# Patient Record
Sex: Male | Born: 1965
Health system: Southern US, Community
[De-identification: ages and names within clinical notes are randomized; demographics above are authoritative.]

## PROBLEM LIST (undated history)

## (undated) DIAGNOSIS — K219 Gastro-esophageal reflux disease without esophagitis: Secondary | ICD-10-CM

---

## 2004-11-17 ENCOUNTER — Encounter: Admission: RE | Admit: 2004-11-17 | Discharge: 2004-11-17 | Payer: Self-pay | Admitting: Specialist

## 2007-07-04 ENCOUNTER — Emergency Department (HOSPITAL_COMMUNITY): Admission: EM | Admit: 2007-07-04 | Discharge: 2007-07-04 | Payer: Self-pay | Admitting: Emergency Medicine
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
## 2012-11-17 ENCOUNTER — Ambulatory Visit (INDEPENDENT_AMBULATORY_CARE_PROVIDER_SITE_OTHER): Payer: Managed Care, Other (non HMO) | Admitting: Internal Medicine

## 2012-11-17 VITALS — BP 130/84 | HR 72 | Temp 98.2°F | Resp 16 | Ht 68.0 in | Wt 192.6 lb

## 2012-11-17 DIAGNOSIS — N508 Other specified disorders of male genital organs: Secondary | ICD-10-CM

## 2012-11-17 DIAGNOSIS — N5312 Painful ejaculation: Secondary | ICD-10-CM

## 2012-11-17 DIAGNOSIS — J069 Acute upper respiratory infection, unspecified: Secondary | ICD-10-CM

## 2012-11-17 DIAGNOSIS — N35919 Unspecified urethral stricture, male, unspecified site: Secondary | ICD-10-CM

## 2012-11-17 LAB — POCT URINALYSIS DIPSTICK
Bilirubin, UA: NEGATIVE
Blood, UA: NEGATIVE
Glucose, UA: NEGATIVE
Nitrite, UA: NEGATIVE
Protein, UA: NEGATIVE
Spec Grav, UA: >=1.03
Urobilinogen, UA: 0.2
pH, UA: 6

## 2012-11-17 LAB — POCT UA - MICROSCOPIC ONLY
Bacteria, U Microscopic: NEGATIVE
Casts, Ur, LPF, POC: NEGATIVE
Crystals, Ur, HPF, POC: NEGATIVE
Epithelial cells, urine per micros: NEGATIVE
RBC, urine, microscopic: NEGATIVE
Yeast, UA: NEGATIVE

## 2012-11-17 NOTE — Patient Instructions (Signed)
Urology consultation. Continue nyquil and otc meds for URI .Acetaminophen as directed by the manufacturer.

## 2012-11-17 NOTE — Progress Notes (Signed)
  Subjective:    Patient ID: Albert Mccormick, male    DOB: 25-Jun-1966, 46 y.o.   MRN: [id number]  HPI Chills, aches, stuffy nose for 2 days taking nyquil No cough, no shortness of breath, no sore throat,no earache,   Painful ejaculation for 5 years, no dysuria, sometimes difficult to void. No std, no discharge, no trauma Onset. Pain is moderated in severity, no radiation, no testicle pain or swelling. It is so painful he has to scream every time he ejaculates.   Review of Systems  Constitutional: Negative.   HENT: Positive for congestion and sneezing.   Eyes: Negative.   Respiratory: Negative.   Cardiovascular: Negative.   Gastrointestinal: Negative.   Genitourinary: Positive for dysuria and penile pain.  Musculoskeletal: Negative.   Skin: Negative.   Neurological: Negative.   Hematological: Negative.   Psychiatric/Behavioral: Negative.   All other systems reviewed and are negative.       Objective:   Physical Exam  Nursing note and vitals reviewed. Constitutional: He is oriented to person, place, and time. He appears well-developed and well-nourished.  HENT:  Head: Normocephalic and atraumatic.  Right Ear: External ear normal.  Left Ear: External ear normal.  Nose: Rhinorrhea present.  Mouth/Throat: Oropharynx is clear and moist.  Eyes: Conjunctivae normal and EOM are normal. Pupils are equal, round, and reactive to light.  Neck: Normal range of motion. Neck supple.  Cardiovascular: Normal rate, regular rhythm, normal heart sounds and intact distal pulses.   Pulmonary/Chest: Effort normal and breath sounds normal.  Abdominal: Soft. Bowel sounds are normal.  Genitourinary: No penile tenderness.       Urethral opening appears stenotic circumsided  Musculoskeletal: Normal range of motion.  Neurological: He is alert and oriented to person, place, and time. He has normal reflexes.  Skin: Skin is warm and dry.  Psychiatric: He has a normal mood and affect. His behavior is  normal. Judgment and thought content normal.     Results for orders placed in visit on 11/17/12  POCT URINALYSIS DIPSTICK      Component Value Range   Color, UA yellow     Clarity, UA ckear     Glucose, UA neg     Bilirubin, UA neg     Ketones, UA trace     Spec Grav, UA >=1.030     Blood, UA neg     pH, UA 6.0     Protein, UA neg     Urobilinogen, UA 0.2     Nitrite, UA neg     Leukocytes, UA Negative    POCT UA - MICROSCOPIC ONLY      Component Value Range   WBC, Ur, HPF, POC 0-2     RBC, urine, microscopic neg     Bacteria, U Microscopic neg     Mucus, UA trace     Epithelial cells, urine per micros neg     Crystals, Ur, HPF, POC neg     Casts, Ur, LPF, POC neg     Yeast, UA neg    ua is normal.     Assessment & Plan:  Will obtain ua and chlamydia gonorrhea testing . Will refer to urology for urethral stenosis and painful ejaculation.

## 2012-11-19 ENCOUNTER — Telehealth: Payer: Self-pay

## 2012-11-19 LAB — GC/CHLAMYDIA PROBE AMP, URINE: Chlamydia, Swab/Urine, PCR: NEGATIVE

## 2012-11-19 NOTE — Telephone Encounter (Signed)
Pt is calling back for lab results  Best number [phone]

## 2012-11-19 NOTE — Telephone Encounter (Signed)
See labs 

## 2014-11-19 ENCOUNTER — Ambulatory Visit: Payer: BC Managed Care – PPO

## 2014-11-20 ENCOUNTER — Ambulatory Visit (INDEPENDENT_AMBULATORY_CARE_PROVIDER_SITE_OTHER): Payer: BC Managed Care – PPO | Admitting: Family Medicine

## 2014-11-20 VITALS — BP 118/74 | HR 73 | Temp 98.6°F | Resp 16 | Ht 68.0 in | Wt 200.0 lb

## 2014-11-20 DIAGNOSIS — R82998 Other abnormal findings in urine: Secondary | ICD-10-CM

## 2014-11-20 DIAGNOSIS — R112 Nausea with vomiting, unspecified: Secondary | ICD-10-CM

## 2014-11-20 DIAGNOSIS — Z202 Contact with and (suspected) exposure to infections with a predominantly sexual mode of transmission: Secondary | ICD-10-CM

## 2014-11-20 DIAGNOSIS — M791 Myalgia, unspecified site: Secondary | ICD-10-CM

## 2014-11-20 DIAGNOSIS — H60392 Other infective otitis externa, left ear: Secondary | ICD-10-CM

## 2014-11-20 DIAGNOSIS — R8299 Other abnormal findings in urine: Secondary | ICD-10-CM

## 2014-11-20 DIAGNOSIS — T732XXA Exhaustion due to exposure, initial encounter: Secondary | ICD-10-CM

## 2014-11-20 LAB — POCT CBC
Granulocyte percent: 59.7 %G (ref 37–80)
HCT, POC: 50.5 % (ref 43.5–53.7)
Hemoglobin: 16.6 g/dL (ref 14.1–18.1)
Lymph, poc: 2 (ref 0.6–3.4)
MCH, POC: 30.1 pg (ref 27–31.2)
MCHC: 32.9 g/dL (ref 31.8–35.4)
MCV: 91.7 fL (ref 80–97)
MID (cbc): 0.2 (ref 0–0.9)
MPV: 7.5 fL (ref 0–99.8)
POC Granulocyte: 3.2 (ref 2–6.9)
POC LYMPH PERCENT: 36.2 %L (ref 10–50)
POC MID %: 4.1 %M (ref 0–12)
Platelet Count, POC: 185 10*3/uL (ref 142–424)
RBC: 5.51 M/uL (ref 4.69–6.13)
RDW, POC: 13.8 %
WBC: 5.4 10*3/uL (ref 4.6–10.2)

## 2014-11-20 LAB — COMPREHENSIVE METABOLIC PANEL
ALT: 24 U/L (ref 0–53)
AST: 21 U/L (ref 0–37)
Albumin: 4.2 g/dL (ref 3.5–5.2)
Alkaline Phosphatase: 91 U/L (ref 39–117)
BUN: 14 mg/dL (ref 6–23)
CO2: 27 mEq/L (ref 19–32)
Calcium: 9.4 mg/dL (ref 8.4–10.5)
Chloride: 103 mEq/L (ref 96–112)
Creat: 0.99 mg/dL (ref 0.50–1.35)
Glucose, Bld: 103 mg/dL — ABNORMAL HIGH (ref 70–99)
Potassium: 4.1 mEq/L (ref 3.5–5.3)
Sodium: 139 mEq/L (ref 135–145)
Total Bilirubin: 0.3 mg/dL (ref 0.2–1.2)
Total Protein: 7 g/dL (ref 6.0–8.3)

## 2014-11-20 LAB — POCT URINALYSIS DIPSTICK
Bilirubin, UA: NEGATIVE
Blood, UA: NEGATIVE
Glucose, UA: NEGATIVE
Ketones, UA: NEGATIVE
Leukocytes, UA: NEGATIVE
Nitrite, UA: NEGATIVE
Protein, UA: NEGATIVE
Spec Grav, UA: 1.025
Urobilinogen, UA: 0.2
pH, UA: 6

## 2014-11-20 LAB — POCT UA - MICROSCOPIC ONLY
Casts, Ur, LPF, POC: NEGATIVE
Crystals, Ur, HPF, POC: NEGATIVE
Yeast, UA: NEGATIVE

## 2014-11-20 LAB — HIV ANTIBODY (ROUTINE TESTING W REFLEX): HIV 1&2 Ab, 4th Generation: NONREACTIVE

## 2014-11-20 LAB — GLUCOSE, POCT (MANUAL RESULT ENTRY): POC Glucose: 113 mg/dl — AB (ref 70–99)

## 2014-11-20 LAB — RPR

## 2014-11-20 MED ORDER — ONDANSETRON 4 MG PO TBDP: 4.0000 mg | ORAL_TABLET | Freq: Three times a day (TID) | ORAL | Status: DC | PRN

## 2014-11-20 MED ORDER — NAPROXEN 500 MG PO TABS: 500.0000 mg | ORAL_TABLET | Freq: Two times a day (BID) | ORAL | Status: DC

## 2014-11-20 MED ORDER — OFLOXACIN 0.3 % OT SOLN: 5.0000 [drp] | Freq: Two times a day (BID) | OTIC | Status: DC

## 2014-11-20 NOTE — Progress Notes (Signed)
Subjective: For the last 2 or 3 weeks the patient has been feeling bad. He works at a [employer]. He has been working in the cockpit, then had to be outside in the cold for couple of hours. Since then he has been having fatigue, generalized malaise, nausea, dark yellow urine, and just doesn't feel well. He is divorced and now has a fianc. No major concerns of STDs, but has had potential exposure in the change of partners.  Objective: 48 year old man major distress. He speaks with an accent from [place]. TMs are both occluded with cerumen. He has throat is clear. Neck supple without significant nodes. No thyromegaly. Chest is clear to auscultation heart regular without murmurs gallops or arrhythmias. Abdomen soft without mass tenderness. No CVA tenderness.  Assessment: Fatigue Dark yellow urine Generalized malaise Nausea Potential of STD exposures Cerumen impaction.  Plan: Labs Irrigate ears  Results for orders placed or performed in visit on 11/20/14  POCT CBC  Result Value Ref Range   WBC 5.4 4.6 - 10.2 K/uL   Lymph, poc 2.0 0.6 - 3.4   POC LYMPH PERCENT 36.2 10 - 50 %L   MID (cbc) 0.2 0 - 0.9   POC MID % 4.1 0 - 12 %M   POC Granulocyte 3.2 2 - 6.9   Granulocyte percent 59.7 37 - 80 %G   RBC 5.51 4.69 - 6.13 M/uL   Hemoglobin 16.6 14.1 - 18.1 g/dL   HCT, POC 16.150.5 09.643.5 - 53.7 %   MCV 91.7 80 - 97 fL   MCH, POC 30.1 27 - 31.2 pg   MCHC 32.9 31.8 - 35.4 g/dL   RDW, POC 04.513.8 %   Platelet Count, POC 185 142 - 424 K/uL   MPV 7.5 0 - 99.8 fL  POCT glucose (manual entry)  Result Value Ref Range   POC Glucose 113 (A) 70 - 99 mg/dl  POCT UA - Microscopic Only  Result Value Ref Range   WBC, Ur, HPF, POC 0-2    RBC, urine, microscopic 0-1    Bacteria, U Microscopic trace    Mucus, UA trace    Epithelial cells, urine per micros 1-3    Crystals, Ur, HPF, POC neg    Casts, Ur, LPF, POC neg    Yeast, UA neg   POCT urinalysis dipstick  Result Value Ref Range   Color, UA  yellow    Clarity, UA clear    Glucose, UA neg    Bilirubin, UA neg    Ketones, UA neg    Spec Grav, UA 1.025    Blood, UA neg    pH, UA 6.0    Protein, UA neg    Urobilinogen, UA 0.2    Nitrite, UA neg    Leukocytes, UA Negative    The medical assistant irrigated a large amount of cerumen out of both ears but was unable to get some white material out of the left ear. Under direct visualization this was irrigated and then removed with alligator forceps. It was a twist of probable cotton or tissue foreign object in the ear. It causes some pain, and it looks very inflamed where that was plastered to the ear canal. This will be treated.  Naproxen for the myalgia and generalized malaise  Drink more water  Take ondansetron if needed for nausea

## 2014-11-20 NOTE — Patient Instructions (Signed)
Take the ondansetron one every 6 hours only when needed for feeling like you are going to vomit  Use the eardrops 5 drops into the left ear twice daily for a few days  Take naproxen one twice daily for the aching and generalized bad feeling  Drink more water and make sure you get enough rest  Return if not improving  I will let you know the results of the rest your labs

## 2014-11-23 LAB — GC/CHLAMYDIA PROBE AMP
CT Probe RNA: NEGATIVE
GC Probe RNA: NEGATIVE

## 2014-11-25 ENCOUNTER — Encounter: Payer: Self-pay | Admitting: Family Medicine

## 2017-12-12 ENCOUNTER — Other Ambulatory Visit: Payer: Self-pay

## 2017-12-12 ENCOUNTER — Ambulatory Visit (INDEPENDENT_AMBULATORY_CARE_PROVIDER_SITE_OTHER): Payer: BLUE CROSS/BLUE SHIELD | Admitting: Family Medicine

## 2017-12-12 ENCOUNTER — Encounter: Payer: Self-pay | Admitting: Family Medicine

## 2017-12-12 VITALS — BP 120/66 | HR 64 | Temp 98.4°F | Resp 16 | Ht 68.0 in | Wt 197.4 lb

## 2017-12-12 DIAGNOSIS — Z113 Encounter for screening for infections with a predominantly sexual mode of transmission: Secondary | ICD-10-CM | POA: Diagnosis not present

## 2017-12-12 DIAGNOSIS — N5312 Painful ejaculation: Secondary | ICD-10-CM

## 2017-12-12 DIAGNOSIS — Z23 Encounter for immunization: Secondary | ICD-10-CM

## 2017-12-12 DIAGNOSIS — Z1211 Encounter for screening for malignant neoplasm of colon: Secondary | ICD-10-CM

## 2017-12-12 DIAGNOSIS — Z125 Encounter for screening for malignant neoplasm of prostate: Secondary | ICD-10-CM | POA: Diagnosis not present

## 2017-12-12 LAB — POCT URINALYSIS DIP (MANUAL ENTRY)
BILIRUBIN UA: NEGATIVE
Blood, UA: NEGATIVE
GLUCOSE UA: NEGATIVE mg/dL
Ketones, POC UA: NEGATIVE mg/dL
Leukocytes, UA: NEGATIVE
Nitrite, UA: NEGATIVE
Protein Ur, POC: NEGATIVE mg/dL
Spec Grav, UA: 1.01 (ref 1.010–1.025)
UROBILINOGEN UA: 0.2 U/dL
pH, UA: 5.5 (ref 5.0–8.0)

## 2017-12-12 NOTE — Progress Notes (Deleted)
No chief complaint on file.   Subjective:  Albert Mccormick is a 52 y.o. male here for a health maintenance visit.  Patient is {new /established:20252} pt  There are no active problems to display for this patient.   No past medical history on file.  No past surgical history on file.   Outpatient Medications Prior to Visit  Medication Sig Dispense Refill  . DM-Doxylamine-Acetaminophen (NYQUIL COLD & FLU PO) Take by mouth.    . naproxen (NAPROSYN) 500 MG tablet Take 1 tablet (500 mg total) by mouth 2 (two) times daily with a meal. 30 tablet 0  . ofloxacin (FLOXIN) 0.3 % otic solution Place 5 drops into the left ear 2 (two) times daily. 5 mL 0  . ondansetron (ZOFRAN ODT) 4 MG disintegrating tablet Take 1 tablet (4 mg total) by mouth every 8 (eight) hours as needed for nausea or vomiting. 10 tablet 0   No facility-administered medications prior to visit.     No Known Allergies   No family history on file.   Health Habits: Dental Exam: up to date Eye Exam: up to date Exercise: *** times/week on average Current exercise activities: walking/running Diet:   Social History   Socioeconomic History  . Marital status: Married    Spouse name: Not on file  . Number of children: Not on file  . Years of education: Not on file  . Highest education level: Not on file  Social Needs  . Financial resource strain: Not on file  . Food insecurity - worry: Not on file  . Food insecurity - inability: Not on file  . Transportation needs - medical: Not on file  . Transportation needs - non-medical: Not on file  Occupational History  . Not on file  Tobacco Use  . Smoking status: Never Smoker  Substance and Sexual Activity  . Alcohol use: No  . Drug use: No  . Sexual activity: Not on file  Other Topics Concern  . Not on file  Social History Narrative  . Not on file   Social History   Substance and Sexual Activity  Alcohol Use No   Social History   Tobacco Use  Smoking  Status Never Smoker   Social History   Substance and Sexual Activity  Drug Use No     Health Maintenance: See under health Maintenance activity for review of completion dates as well.  There is no immunization history on file for this patient.    Depression Screen-PHQ2/9 No flowsheet data found.      Depression Severity and Treatment Recommendations:  0-4= None  5-9= Mild / Treatment: Support, educate to call if worse; return in one month  10-14= Moderate / Treatment: Support, watchful waiting; Antidepressant or Psycotherapy  15-19= Moderately severe / Treatment: Antidepressant OR Psychotherapy  >= 20 = Major depression, severe / Antidepressant AND Psychotherapy    Review of Systems   ROS  See HPI for ROS as well.    Objective:   There were no vitals filed for this visit.  There is no height or weight on file to calculate BMI.  Physical Exam     Assessment/Plan:   Patient was seen for a health maintenance exam.  Counseled the patient on health maintenance issues. Reviewed her health mainteance schedule and ordered appropriate tests (see orders.) Counseled on regular exercise and weight management. Recommend regular eye exams and dental cleaning.   The following issues were addressed today for health maintenance:   There are no diagnoses  linked to this encounter.  No Follow-up on file.    There is no height or weight on file to calculate BMI.:  Discussed the patient's BMI with patient. The BMI body mass index is unknown because there is no height or weight on file.     Future Appointments  Date Time Provider Department Center  12/12/2017  8:20 AM Doristine BosworthStallings, Anupama Piehl A, MD [facility]CP-[facility]    There are no Patient Instructions on file for this visit.

## 2017-12-12 NOTE — Patient Instructions (Addendum)
Try tinactin powder for your feet and change your socks often to prevent foot odor   Colorectal Cancer Screening Colorectal cancer screening is a group of tests used to check for colorectal cancer. Colorectal refers to your colon and rectum. Your colon and rectum are located at the end of your large intestine and carry your bowel movements out of your body. Why is colorectal cancer screening done? It is common for abnormal growths (polyps) to form in the lining of your colon, especially as you get older. These polyps can be cancerous or become cancerous. If colorectal cancer is found at an early stage, it is treatable. Who should be screened for colorectal cancer? Screening is recommended for all adults at average risk starting at age 52. Tests may be recommended every 1 to 10 years. Your health care provider may recommend earlier or more frequent screening if you have:  A history of colorectal cancer or polyps.  A family member with a history of colorectal cancer or polyps.  Inflammatory bowel disease, such as ulcerative colitis or Crohn disease.  A type of hereditary colon cancer syndrome.  Colorectal cancer symptoms.  Types of screening tests There are several types of colorectal screening tests. They include:  Guaiac-based fecal occult blood testing.  Fecal immunochemical test (FIT).  Stool DNA test.  Barium enema.  Virtual colonoscopy.  Sigmoidoscopy. During this test, a sigmoidoscope is used to examine your rectum and lower colon. A sigmoidoscope is a flexible tube with a camera that is inserted through your anus into your rectum and lower colon.  Colonoscopy. During this test, a colonoscope is used to examine your entire colon. A colonoscope is a long, thin, flexible tube with a camera. This test examines your entire colon and rectum.  This information is not intended to replace advice given to you by your health care provider. Make sure you discuss any questions you have  with your health care provider. Document Released: 05/09/2010 Document Revised: 06/28/2016 Document Reviewed: 02/25/2014 Elsevier Interactive Patient Education  [employer]2018 Elsevier Inc.

## 2017-12-12 NOTE — Progress Notes (Signed)
Chief Complaint  Patient presents with  . Urinary Tract Infection    pain with ejaculation    HPI  He reports that when he ejaculates there is pain in his penis He states that his partner had testing and she is fine He reports that he has had this with 2 separate partners over the course of 3 years The pain is sharp and stabbing in character that causes him to stop intercourse He states that if he masturbates there is no pain  No blood in the semen No difficulty with erections No pain in the scrotum He denies dysuria No rectal pain  He denies history of std  He was previously with his wife and now is with his fiance   4 review of systems  No past medical history on file.  Current Outpatient Medications  Medication Sig Dispense Refill  . DM-Doxylamine-Acetaminophen (NYQUIL COLD & FLU PO) Take by mouth.    . naproxen (NAPROSYN) 500 MG tablet Take 1 tablet (500 mg total) by mouth 2 (two) times daily with a meal. (Patient not taking: Reported on 12/12/2017) 30 tablet 0  . ofloxacin (FLOXIN) 0.3 % otic solution Place 5 drops into the left ear 2 (two) times daily. (Patient not taking: Reported on 12/12/2017) 5 mL 0  . ondansetron (ZOFRAN ODT) 4 MG disintegrating tablet Take 1 tablet (4 mg total) by mouth every 8 (eight) hours as needed for nausea or vomiting. (Patient not taking: Reported on 12/12/2017) 10 tablet 0   No current facility-administered medications for this visit.     Allergies: No Known Allergies  No past surgical history on file.  Social History   Socioeconomic History  . Marital status: Married    Spouse name: Not on file  . Number of children: Not on file  . Years of education: Not on file  . Highest education level: Not on file  Social Needs  . Financial resource strain: Not on file  . Food insecurity - worry: Not on file  . Food insecurity - inability: Not on file  . Transportation needs - medical: Not on file  . Transportation needs - non-medical:  Not on file  Occupational History  . Not on file  Tobacco Use  . Smoking status: Never Smoker  . Smokeless tobacco: Never Used  Substance and Sexual Activity  . Alcohol use: No  . Drug use: No  . Sexual activity: Not on file  Other Topics Concern  . Not on file  Social History Narrative  . Not on file    No family history on file.   ROS Review of Systems See HPI Constitution: No fevers or chills No malaise No diaphoresis Skin: No rash or itching Eyes: no blurry vision, no double vision GU: no dysuria or hematuria, see hpi Neuro: no dizziness or headaches  all others reviewed and negative   Objective: Vitals:   12/12/17 0841  BP: 120/66  Pulse: 64  Resp: 16  Temp: 98.4 F (36.9 C)  TempSrc: Oral  SpO2: 99%  Weight: 197 lb 6.4 oz (89.5 kg)  Height: 5\' 8"  (1.727 m)    Physical Exam  Constitutional: He is oriented to person, place, and time. He appears well-developed and well-nourished.  HENT:  Head: Normocephalic and atraumatic.  Eyes: Conjunctivae and EOM are normal.  Neck: Normal range of motion. No thyromegaly present.  Cardiovascular: Normal rate, regular rhythm and normal heart sounds.  Pulmonary/Chest: Effort normal and breath sounds normal. No stridor. No respiratory distress.  Abdominal: Soft. Bowel sounds are normal. He exhibits no distension and no mass. There is no tenderness. There is no rebound and no guarding. No hernia.  Neurological: He is alert and oriented to person, place, and time.  Psychiatric: He has a normal mood and affect. His behavior is normal. Judgment and thought content normal.    GU exam chaperone present Circumcised penis No inguinal hernias Normal testicular exam Normal penile exam No tenderness No LAD Rectal exam  Normal rectal tone Normal prostate exam  Component     Latest Ref Rng & Units 12/12/2017 12/12/2017        10:49 AM 10:49 AM  Color, UA     yellow yellow   Clarity, UA     clear clear   Glucose      negative mg/dL negative   Bilirubin, UA     negative negative negative  Specific Gravity, UA     1.010 - 1.025 1.010   RBC, UA     negative negative   pH, UA     5.0 - 8.0 5.5   Protein, UA     negative mg/dL negative   Urobilinogen, UA     0.2 or 1.0 E.U./dL 0.2   Nitrite, UA     Negative Negative   Leukocytes, UA     Negative Negative      Assessment and Plan Leo Grossermbrose was seen today for annual exam.  Diagnoses and all orders for this visit:  Painful ejaculation- will check for factors such as prostate disease or infection -     PSA -     POCT urinalysis dipstick  Screen for STD (sexually transmitted disease) -     GC/Chlamydia Probe Amp -     HIV antibody -     RPR -     Hepatitis B surface antigen  Screening for prostate cancer- no family history, routine screening -     PSA  Encounter for screening colonoscopy- discussed screening for colon cancer -     Ambulatory referral to Gastroenterology  Flu vaccine need -     Flu Vaccine QUAD 36+ mos IM  Other orders -     Cancel: Tdap vaccine greater than or equal to 7yo IM -     Cancel: Ambulatory referral to Gastroenterology   A total of 25 minutes were spent face-to-face with the patient during this encounter and over half of that time was spent on counseling and coordination of care.   Zoe A Stallings

## 2017-12-14 LAB — PSA: PROSTATE SPECIFIC AG, SERUM: 1.4 ng/mL (ref 0.0–4.0)

## 2017-12-14 LAB — GC/CHLAMYDIA PROBE AMP
Chlamydia trachomatis, NAA: NEGATIVE
Neisseria gonorrhoeae by PCR: NEGATIVE

## 2017-12-14 LAB — HEPATITIS B SURFACE ANTIGEN: Hepatitis B Surface Ag: NEGATIVE

## 2017-12-14 LAB — RPR: RPR: NONREACTIVE

## 2017-12-14 LAB — HIV ANTIBODY (ROUTINE TESTING W REFLEX): HIV SCREEN 4TH GENERATION: NONREACTIVE

## 2017-12-17 ENCOUNTER — Encounter: Payer: Self-pay | Admitting: Internal Medicine

## 2017-12-25 ENCOUNTER — Encounter: Payer: Self-pay | Admitting: Family Medicine

## 2017-12-26 ENCOUNTER — Telehealth: Payer: Self-pay

## 2017-12-26 NOTE — Telephone Encounter (Signed)
Lab results sent via mail to home address. Dgaddy, CMA 

## 2018-02-05 ENCOUNTER — Encounter: Payer: Self-pay | Admitting: *Deleted

## 2018-02-05 NOTE — Progress Notes (Signed)
[facility]. He explains that he is here for "penis problem", then he explains what is going on every time he has sex. I did see this information in his PCP notes from Dr.Stallings. I did explain to him the reason why a colonoscopy is done and gave him a informational pamphlet. Answered all of his questions as best I could. Pt decided he does not want to have this because he does not want a "big bill". Explained that he could check with his insurance provider that this routine "screening" colonoscopy maybe covered by his insurance. He still decided to cancel the colonoscopy at this time and he will go back to see his PCP for his "penis problem".  Pt aware his appointments here are cancelled.

## 2018-02-19 ENCOUNTER — Encounter: Payer: Self-pay | Admitting: Internal Medicine

## 2019-05-21 ENCOUNTER — Telehealth: Payer: Self-pay | Admitting: Family Medicine

## 2019-05-21 NOTE — Telephone Encounter (Signed)
Called pt to reschedule Physical that he cancelled for 05/22/2019 on 05/20/2019 pt was inable to reschedule he will call us back he [place] the moment [place]

## 2019-05-22 ENCOUNTER — Encounter: Payer: BLUE CROSS/BLUE SHIELD | Admitting: Family Medicine

## 2020-03-31 ENCOUNTER — Encounter: Payer: Self-pay | Admitting: Adult Health Nurse Practitioner

## 2020-03-31 ENCOUNTER — Other Ambulatory Visit: Payer: Self-pay

## 2020-03-31 ENCOUNTER — Ambulatory Visit (INDEPENDENT_AMBULATORY_CARE_PROVIDER_SITE_OTHER): Payer: 59 | Admitting: Adult Health Nurse Practitioner

## 2020-03-31 VITALS — BP 124/78 | HR 75 | Temp 98.0°F | Resp 17 | Ht 68.0 in | Wt 209.0 lb

## 2020-03-31 DIAGNOSIS — Z1211 Encounter for screening for malignant neoplasm of colon: Secondary | ICD-10-CM

## 2020-03-31 DIAGNOSIS — R5383 Other fatigue: Secondary | ICD-10-CM

## 2020-03-31 DIAGNOSIS — M25449 Effusion, unspecified hand: Secondary | ICD-10-CM | POA: Diagnosis not present

## 2020-03-31 DIAGNOSIS — Z125 Encounter for screening for malignant neoplasm of prostate: Secondary | ICD-10-CM

## 2020-03-31 DIAGNOSIS — Z1321 Encounter for screening for nutritional disorder: Secondary | ICD-10-CM | POA: Diagnosis not present

## 2020-03-31 MED ORDER — METHYLPREDNISOLONE 4 MG PO TBPK: ORAL_TABLET | ORAL | 0 refills | Status: DC

## 2020-03-31 MED ORDER — MELOXICAM 15 MG PO TABS: 15.0000 mg | ORAL_TABLET | Freq: Every day | ORAL | 0 refills | Status: DC

## 2020-03-31 NOTE — Patient Instructions (Addendum)
Arthritis °Arthritis is a term that is commonly used to refer to joint pain or joint disease. There are more than 100 types of arthritis. °What are the causes? °The most common cause of this condition is wear and tear of a joint. Other causes include: °· Gout. °· Inflammation of a joint. °· An infection of a joint. °· Sprains and other injuries near the joint. °· A reaction to medicines or drugs, or an allergic reaction. °In some cases, the cause may not be known. °What are the signs or symptoms? °The main symptom of this condition is pain in the joint during movement. Other symptoms include: °· Redness, swelling, or stiffness at a joint. °· Warmth coming from the joint. °· Fever. °· Overall feeling of illness. °How is this diagnosed? °This condition may be diagnosed with a physical exam and tests, including: °· Blood tests. °· Urine tests. °· Imaging tests, such as X-rays, an MRI, or a CT scan. °Sometimes, fluid is removed from a joint for testing. °How is this treated? °This condition may be treated with: °· Treatment of the cause, if it is known. °· Rest. °· Raising (elevating) the joint. °· Applying cold or hot packs to the joint. °· Medicines to improve symptoms and reduce inflammation. °· Injections of a steroid such as cortisone into the joint to help reduce pain and inflammation. °Depending on the cause of your arthritis, you may need to make lifestyle changes to reduce stress on your joint. Changes may include: °· Exercising more. °· Losing weight. °Follow these instructions at home: °Medicines °· Take over-the-counter and prescription medicines only as told by your health care provider. °· Do not take aspirin to relieve pain if your health care provider thinks that gout may be causing your pain. °Activity °· Rest your joint if told by your health care provider. Rest is important when your disease is active and your joint feels painful, swollen, or stiff. °· Avoid activities that make the pain worse. It is  important to balance activity with rest. °· Exercise your joint regularly with range-of-motion exercises as told by your health care provider. Try doing low-impact exercise, such as: °? Swimming. °? Water aerobics. °? Biking. °? Walking. °Managing pain, stiffness, and swelling ° °  ° °· If directed, put ice on the joint. °? Put ice in a plastic bag. °? Place a towel between your skin and the bag. °? Leave the ice on for 20 minutes, 2-3 times per day. °· If your joint is swollen, raise (elevate) it above the level of your heart if directed by your health care provider. °· If your joint feels stiff in the morning, try taking a warm shower. °· If directed, apply heat to the affected area as often as told by your health care provider. Use the heat source that your health care provider recommends, such as a moist heat pack or a heating pad. If you have diabetes, do not apply heat without permission from your health care provider. To apply heat: °? Place a towel between your skin and the heat source. °? Leave the heat on for 20-30 minutes. °? Remove the heat if your skin turns bright red. This is especially important if you are unable to feel pain, heat, or cold. You may have a greater risk of getting burned. °General instructions °· Do not use any products that contain nicotine or tobacco, such as cigarettes, e-cigarettes, and chewing tobacco. If you need help quitting, ask your health care provider. °· Keep   all follow-up visits as told by your health care provider. This is important. °Contact a health care provider if: °· The pain gets worse. °· You have a fever. °Get help right away if: °· You develop severe joint pain, swelling, or redness. °· Many joints become painful and swollen. °· You develop severe back pain. °· You develop severe weakness in your leg. °· You cannot control your bladder or bowels. °Summary °· Arthritis is a term that is commonly used to refer to joint pain or joint disease. There are more than  100 types of arthritis. °· The most common cause of this condition is wear and tear of a joint. Other causes include gout, inflammation or infection of the joint, sprains, or allergies. °· Symptoms of this condition include redness, swelling, or stiffness of the joint. Other symptoms include warmth, fever, or feeling ill. °· This condition is treated with rest, elevation, medicines, and applying cold or hot packs. °· Follow your health care provider's instructions about medicines, activity, exercises, and other home care treatments. °This information is not intended to replace advice given to you by your health care provider. Make sure you discuss any questions you have with your health care provider. °Document Revised: 10/27/2018 Document Reviewed: 10/27/2018 °Elsevier Patient Education © 2020 Elsevier Inc. ° °

## 2020-03-31 NOTE — Progress Notes (Signed)
Chief Complaint  Patient presents with  . right hand pain(middle finger) x 3-4 months intermittent but    Pain level 10/10, no otc meds taken for pain.  pt c/o fatigue also    HPI  Patient presents having not been seen in this clinic since 2019. Presents today with 2 concerns  1. Swelling to his PIP joint third digit, right hand. Occurs intermittently and is self-limiting. Will last for 1 to 2 weeks at a time and improved. He has had no injury to the finger. He does work as an Conservation officer, nature in [employer]. He has to grip tools and use his hands extensively. No other fingers or wrist becomes swollen. He denies any numbness, tingling. Pain is present with pressure to the joint, gripping, and flexion of the finger.  2. Fatigue. He is 54 years old and feels that he does not have as much energy when he was working 80 hours a week 10 years ago. Feels that he is tight and his upper chest or when [place] at the airport feels more fatigued than usual. He stays very fit, stays active on his job. He does not smoke or drink alcohol. He does not know his paternal family history but in his maternal family, there is some heart disease and diabetes. He starts getting worried about cancer with the fatigue and the is concerned that he could have cancer. No problems with sleep. No problems with depression.  Problem List    Problem List: 2021-04: Effusion of finger joint 2021-04: Fatigue 2021-04: Colon cancer screening   Allergies   has No Known Allergies.  Medications    Current Outpatient Medications:  .  DM-Doxylamine-Acetaminophen (NYQUIL COLD & FLU PO), Take by mouth., Disp: , Rfl:  .  naproxen (NAPROSYN) 500 MG tablet, Take 1 tablet (500 mg total) by mouth 2 (two) times daily with a meal. (Patient not taking: Reported on 12/12/2017), Disp: 30 tablet, Rfl: 0 .  ofloxacin (FLOXIN) 0.3 % otic solution, Place 5 drops into the left ear 2 (two) times daily. (Patient not taking: Reported  on 12/12/2017), Disp: 5 mL, Rfl: 0 .  ondansetron (ZOFRAN ODT) 4 MG disintegrating tablet, Take 1 tablet (4 mg total) by mouth every 8 (eight) hours as needed for nausea or vomiting. (Patient not taking: Reported on 12/12/2017), Disp: 10 tablet, Rfl: 0   Review of Systems    Constitutional: Negative for activity change, positive for fatigue, increased work of breathing with activity HEENT: no nosebleeds, trouble swallowing and voice change.   Respiratory: Negative for cough, shortness of breath and wheezing.   Cardiac:  Negative for chest pain, pressure, syncope  Gastrointestinal: Negative for diarrhea, nausea and vomiting.  Genitourinary: Negative for difficulty urinating, dysuria, flank pain and hematuria.  Musculoskeletal: Negative for back pain, joint swelling and neck pain. Positive for unilateral joint effusion and pain Neurological: Negative for dizziness, speech difficulty, light-headedness and numbness.  See HPI. All other review of systems negative.     Physical Exam:    height is '5\' 8"'  (1.727 m) and weight is 209 lb (94.8 kg). His temporal temperature is 98 F (36.7 C). His blood pressure is 124/78 and his pulse is 75. His respiration is 17 and oxygen saturation is 100%.   Physical Examination: General appearance - alert, well appearing, and in no distress and oriented to person, place, and time Mental status - normal mood, behavior, speech, dress, motor activity, and thought processes Eyes - PERRL. Extraocular movements  intact.  No nystagmus.  Neck - supple, no significant adenopathy, carotids upstroke normal bilaterally, no bruits, thyroid exam: thyroid is normal in size without nodules or tenderness Chest - clear to auscultation, no wheezes, rales or rhonchi, symmetric air entry  Diminished breath sounds/air movement to lower lobes with short expiratory phase Heart - normal rate, regular rhythm, normal S1, S2, no murmurs, rubs, clicks or gallops Musculoskeletal exam: abnormal  exam of right PIP, 3rd digit significant for tenderness on palpation, effusion without warmth or erythema, sensation intact, full ROM but pain with flexion of finger.. Skin - normal coloration and turgor, no rashes, no suspicious skin lesions noted  No hyperpigmentation of skin.  No current hematomas noted   Lab /Imaging Review    Last labs I can review were 19 Jan16 in [facility] Every[facility] from [facility]: Cr:  1.20 Glucose:  122 Lipids:  TC=155 TG=181 LDL=82 HDL=37  Assessment & Plan:  Valerian Jewel is a 54 y.o. male    1. Effusion of finger joint   2. Fatigue, unspecified type   3. Colon cancer screening     Orders Placed This Encounter  Procedures  . PSA  . CMP14+EGFR  . TSH  . CBC with Differential/Platelet  . VITAMIN D 25 Hydroxy (Vit-D Deficiency, Fractures)  . Ambulatory referral to Orthopedic Surgery  . Ambulatory referral to Gastroenterology   Meds ordered this encounter  Medications  . methylPREDNISolone (MEDROL DOSEPAK) 4 MG TBPK tablet    Sig: As directed on pkg label    Dispense:  21 each    Refill:  0  . meloxicam (MOBIC) 15 MG tablet    Sig: Take 1 tablet (15 mg total) by mouth daily.    Dispense:  30 tablet    Refill:  0   Patient will f/u in 7-10 days for review of lab work.  Reviewed verbally and gave written instructions on dosepack to take now and Meloxicam prn in the future for isolated joint pain.  He verbalized understanding.  A total of 45 minutes were spent face-to-face with the patient during this encounter and over half of that time was spent on counseling and coordination of care.    Glyn Ade, NP

## 2020-04-01 LAB — CMP14+EGFR
ALT: 31 IU/L (ref 0–44)
AST: 22 IU/L (ref 0–40)
Albumin/Globulin Ratio: 1.4 (ref 1.2–2.2)
Albumin: 4.3 g/dL (ref 3.8–4.9)
Alkaline Phosphatase: 73 IU/L (ref 39–117)
BUN/Creatinine Ratio: 15 (ref 9–20)
BUN: 16 mg/dL (ref 6–24)
Bilirubin Total: 0.3 mg/dL (ref 0.0–1.2)
CO2: 24 mmol/L (ref 20–29)
Calcium: 9.3 mg/dL (ref 8.7–10.2)
Chloride: 105 mmol/L (ref 96–106)
Creatinine, Ser: 1.1 mg/dL (ref 0.76–1.27)
GFR calc Af Amer: 88 mL/min/{1.73_m2} (ref >59)
GFR calc non Af Amer: 76 mL/min/{1.73_m2} (ref >59)
Globulin, Total: 3 g/dL (ref 1.5–4.5)
Glucose: 79 mg/dL (ref 65–99)
Potassium: 4.4 mmol/L (ref 3.5–5.2)
Sodium: 140 mmol/L (ref 134–144)
Total Protein: 7.3 g/dL (ref 6.0–8.5)

## 2020-04-01 LAB — CBC WITH DIFFERENTIAL/PLATELET
Basophils Absolute: 0 10*3/uL (ref 0.0–0.2)
Basos: 1 %
EOS (ABSOLUTE): 0.1 10*3/uL (ref 0.0–0.4)
Eos: 2 %
Hematocrit: 50.7 % (ref 37.5–51.0)
Hemoglobin: 17.2 g/dL (ref 13.0–17.7)
Immature Grans (Abs): 0 10*3/uL (ref 0.0–0.1)
Immature Granulocytes: 0 %
Lymphocytes Absolute: 2 10*3/uL (ref 0.7–3.1)
Lymphs: 32 %
MCH: 30.2 pg (ref 26.6–33.0)
MCHC: 33.9 g/dL (ref 31.5–35.7)
MCV: 89 fL (ref 79–97)
Monocytes Absolute: 1 10*3/uL — ABNORMAL HIGH (ref 0.1–0.9)
Monocytes: 15 %
Neutrophils Absolute: 3.2 10*3/uL (ref 1.4–7.0)
Neutrophils: 50 %
Platelets: 212 10*3/uL (ref 150–450)
RBC: 5.69 x10E6/uL (ref 4.14–5.80)
RDW: 13.9 % (ref 11.6–15.4)
WBC: 6.3 10*3/uL (ref 3.4–10.8)

## 2020-04-01 LAB — TSH: TSH: 1.24 u[IU]/mL (ref 0.450–4.500)

## 2020-04-01 LAB — VITAMIN D 25 HYDROXY (VIT D DEFICIENCY, FRACTURES): Vit D, 25-Hydroxy: 14.8 ng/mL — ABNORMAL LOW (ref 30.0–100.0)

## 2020-04-01 LAB — PSA: Prostate Specific Ag, Serum: 1.4 ng/mL (ref 0.0–4.0)

## 2020-04-12 ENCOUNTER — Ambulatory Visit (INDEPENDENT_AMBULATORY_CARE_PROVIDER_SITE_OTHER): Payer: 59 | Admitting: Orthopaedic Surgery

## 2020-04-12 ENCOUNTER — Ambulatory Visit (INDEPENDENT_AMBULATORY_CARE_PROVIDER_SITE_OTHER): Payer: 59

## 2020-04-12 ENCOUNTER — Ambulatory Visit: Payer: 59 | Admitting: Adult Health Nurse Practitioner

## 2020-04-12 ENCOUNTER — Other Ambulatory Visit: Payer: Self-pay

## 2020-04-12 ENCOUNTER — Encounter: Payer: Self-pay | Admitting: Orthopaedic Surgery

## 2020-04-12 DIAGNOSIS — M79644 Pain in right finger(s): Secondary | ICD-10-CM

## 2020-04-12 NOTE — Progress Notes (Signed)
Office Visit Note   Patient: Albert Mccormick           Date of Birth: 1966/03/01           MRN: [id number] Visit Date: 04/12/2020              Requested by: Wendall Mola, NP [place],  [place] [id number] PCP: Forrest Moron, MD   Assessment & Plan: Visit Diagnoses:  1. 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  I recommend continued to modified activity as needed.  Voltaren gel and Mobic and ice.  I have a low suspicion that it is gout although I did offer to obtain a uric acid level today.  We will see him back as needed.  Follow-Up Instructions: Return if symptoms worsen or fail to improve.   Orders:  Orders Placed This Encounter  Procedures  . XR Finger Middle Right   No orders of the defined types were placed in this encounter.     Procedures: No procedures performed   Clinical Data: No additional findings.   Subjective: Chief Complaint  Patient presents with  . Right Middle Finger - Pain    Albert Mccormick is a 54 year old gentleman comes in for evaluation of swelling of his right long finger.  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  He states that the finger feels numb at times and feels better after taking Mobic.  He does have some swelling mainly to the PIP joint.  Denies a history of gout.  This has happened once before which resolved.  Denies any triggering or trauma.   Review of Systems  Constitutional: Negative.   All other systems reviewed and are negative.    Objective: Vital Signs: There were no vitals taken for this visit.  Physical Exam Vitals and nursing note reviewed.  Constitutional:      Appearance: He is well-developed.  HENT:     Head: Normocephalic and atraumatic.  Eyes:     Pupils: Pupils are equal, round, and reactive to light.  Pulmonary:     Effort: Pulmonary effort is normal.  Abdominal:     Palpations: Abdomen is soft.    Musculoskeletal:        General: Normal range of motion.     Cervical back: Neck supple.  Skin:    General: Skin is warm.  Neurological:     Mental Status: He is alert and oriented to person, place, and time.  Psychiatric:        Behavior: Behavior normal.        Thought Content: Thought content normal.        Judgment: Judgment normal.     Ortho Exam Right hand and long finger show mild swelling.  He is got full range of motion able to make a full composite fist.  Mild tenderness to palpation of the proximal phalanx and PIP joint.  No warmth or signs of infection. Specialty Comments:  No specialty comments available.  Imaging: XR Finger Middle Right  Result Date: 04/12/2020 No acute or structural abnormalities    PMFS History: Patient Active Problem List   Diagnosis Date Noted  . Effusion of finger joint 03/31/2020  . Fatigue 03/31/2020  . Colon cancer screening 03/31/2020  . Encounter for vitamin deficiency screening 03/31/2020  . Prostate cancer screening 03/31/2020   History reviewed. No pertinent past medical history.  History  reviewed. No pertinent family history.  History reviewed. No pertinent surgical history. Social History   Occupational History  . Not on file  Tobacco Use  . Smoking status: Never Smoker  . Smokeless tobacco: Never Used  Substance and Sexual Activity  . Alcohol use: No  . Drug use: No  . Sexual activity: Not on file

## 2020-04-13 ENCOUNTER — Ambulatory Visit: Payer: 59 | Admitting: Adult Health Nurse Practitioner

## 2020-04-13 ENCOUNTER — Other Ambulatory Visit: Payer: Self-pay

## 2020-04-13 ENCOUNTER — Encounter: Payer: Self-pay | Admitting: Registered Nurse

## 2020-04-13 ENCOUNTER — Ambulatory Visit (INDEPENDENT_AMBULATORY_CARE_PROVIDER_SITE_OTHER): Payer: 59 | Admitting: Registered Nurse

## 2020-04-13 VITALS — BP 107/61 | HR 100 | Temp 97.5°F | Resp 17 | Ht 68.0 in | Wt 205.4 lb

## 2020-04-13 DIAGNOSIS — J302 Other seasonal allergic rhinitis: Secondary | ICD-10-CM

## 2020-04-13 DIAGNOSIS — E559 Vitamin D deficiency, unspecified: Secondary | ICD-10-CM

## 2020-04-13 DIAGNOSIS — R0989 Other specified symptoms and signs involving the circulatory and respiratory systems: Secondary | ICD-10-CM | POA: Diagnosis not present

## 2020-04-13 DIAGNOSIS — Z1211 Encounter for screening for malignant neoplasm of colon: Secondary | ICD-10-CM | POA: Diagnosis not present

## 2020-04-13 MED ORDER — GUAIFENESIN-DM 100-10 MG/5ML PO SYRP: 5.0000 mL | ORAL_SOLUTION | ORAL | 0 refills | Status: DC | PRN

## 2020-04-13 MED ORDER — VITAMIN D (ERGOCALCIFEROL) 1.25 MG (50000 UNIT) PO CAPS
50000.0000 [IU] | ORAL_CAPSULE | ORAL | 0 refills | Status: DC
Start: 2020-04-13 — End: 2023-11-15

## 2020-04-13 MED ORDER — MONTELUKAST SODIUM 10 MG PO TABS
10.0000 mg | ORAL_TABLET | Freq: Every day | ORAL | 3 refills | Status: DC
Start: 2020-04-13 — End: 2020-10-25

## 2020-04-13 MED ORDER — CETIRIZINE HCL 10 MG PO TABS: 10.0000 mg | ORAL_TABLET | Freq: Every day | ORAL | 11 refills | Status: DC

## 2020-04-13 NOTE — Progress Notes (Signed)
Established Patient Office Visit  Subjective:  Patient ID: Albert Mccormick, male    DOB: [birth year]  Age: 54 y.o. MRN: [id number]  CC:  Chief Complaint  Patient presents with  . Follow-up    patient he is here for 10 day follow up on finger pain and check on labs. Patient states he has developed a cough yesterday and he took OTC medication but doesn't help    HPI Albert Mccormick presents for lab review  Improving finger pain, steroids helped, seen by specialist, suggested otc voltaren, which has provided further relief  Having some chest congestion and mild dry cough. No hx of seasonal allergies. Took robitussin yesterday with no relief. No sick contacts.  Also notes ongoing fatigue. No substantial changes from last visit  No past medical history on file.  No past surgical history on file.  No family history on file.  Social History   Socioeconomic History  . Marital status: Married    Spouse name: Not on file  . Number of children: Not on file  . Years of education: Not on file  . Highest education level: Not on file  Occupational History  . Not on file  Tobacco Use  . Smoking status: Never Smoker  . Smokeless tobacco: Never Used  Substance and Sexual Activity  . Alcohol use: No  . Drug use: No  . Sexual activity: Not on file  Other Topics Concern  . Not on file  Social History Narrative  . Not on file   Social Determinants of Health   Financial Resource Strain:   . Difficulty of Paying Living Expenses:   Food Insecurity:   . Worried About Programme researcher, broadcasting/film/video in the Last Year:   . Barista in the Last Year:   Transportation Needs:   . Freight forwarder (Medical):   Marland Kitchen Lack of Transportation (Non-Medical):   Physical Activity:   . Days of Exercise per Week:   . Minutes of Exercise per Session:   Stress:   . Feeling of Stress :   Social Connections:   . Frequency of Communication with Friends and Family:   . Frequency of Social Gatherings  with Friends and Family:   . Attends Religious Services:   . Active Member of Clubs or Organizations:   . Attends Banker Meetings:   Marland Kitchen Marital Status:   Intimate Partner Violence:   . Fear of Current or Ex-Partner:   . Emotionally Abused:   Marland Kitchen Physically Abused:   . Sexually Abused:     Outpatient Medications Prior to Visit  Medication Sig Dispense Refill  . meloxicam (MOBIC) 15 MG tablet Take 1 tablet (15 mg total) by mouth daily. (Patient not taking: Reported on 04/13/2020) 30 tablet 0   No facility-administered medications prior to visit.    No Known Allergies  ROS Review of Systems  Constitutional: Negative.   HENT: Negative.   Eyes: Negative.   Respiratory: Negative.   Cardiovascular: Negative.   Gastrointestinal: Negative.   Endocrine: Negative.   Genitourinary: Negative.   Musculoskeletal: Negative.   Skin: Negative.   Allergic/Immunologic: Negative.   Neurological: Negative.   Hematological: Negative.   Psychiatric/Behavioral: Negative.   All other systems reviewed and are negative.     Objective:    Physical Exam  Constitutional: He is oriented to person, place, and time. He appears well-developed and well-nourished. No distress.  Cardiovascular: Normal rate and regular rhythm.  Pulmonary/Chest: Effort normal. No respiratory distress.  Neurological: He is alert and oriented to person, place, and time.  Skin: Skin is warm and dry. No rash noted. He is not diaphoretic. No erythema. No pallor.  Psychiatric: He has a normal mood and affect. His behavior is normal. Judgment and thought content normal.  Nursing note and vitals reviewed.   BP 107/61   Pulse 100   Temp (!) 97.5 F (36.4 C) (Temporal)   Resp 17   Ht 5\' 8"  (1.727 m)   Wt 205 lb 6.4 oz (93.2 kg)   SpO2 98%   BMI 31.23 kg/m  Wt Readings from Last 3 Encounters:  04/13/20 205 lb 6.4 oz (93.2 kg)  03/31/20 209 lb (94.8 kg)  12/12/17 197 lb 6.4 oz (89.5 kg)     Health  Maintenance Due  Topic Date Due  . COLONOSCOPY  Never done    There are no preventive care reminders to display for this patient.  Lab Results  Component Value Date   TSH 1.240 03/31/2020   Lab Results  Component Value Date   WBC 6.3 03/31/2020   HGB 17.2 03/31/2020   HCT 50.7 03/31/2020   MCV 89 03/31/2020   PLT 212 03/31/2020   Lab Results  Component Value Date   NA 140 03/31/2020   K 4.4 03/31/2020   CO2 24 03/31/2020   GLUCOSE 79 03/31/2020   BUN 16 03/31/2020   CREATININE 1.10 03/31/2020   BILITOT 0.3 03/31/2020   ALKPHOS 73 03/31/2020   AST 22 03/31/2020   ALT 31 03/31/2020   PROT 7.3 03/31/2020   ALBUMIN 4.3 03/31/2020   CALCIUM 9.3 03/31/2020   No results found for: CHOL No results found for: HDL No results found for: LDLCALC No results found for: TRIG No results found for: CHOLHDL No results found for: 04/02/2020    Assessment & Plan:   Problem List Items Addressed This Visit    None    Visit Diagnoses    Special screening for malignant neoplasms, colon    -  Primary   Relevant Orders   Ambulatory referral to Gastroenterology   Vitamin D deficiency       Relevant Medications   Vitamin D, Ergocalciferol, (DRISDOL) 1.25 MG (50000 UNIT) CAPS capsule   Chest congestion       Relevant Medications   guaiFENesin-dextromethorphan (ROBITUSSIN DM) 100-10 MG/5ML syrup   Seasonal allergies       Relevant Medications   cetirizine (ZYRTEC) 10 MG tablet   montelukast (SINGULAIR) 10 MG tablet      Meds ordered this encounter  Medications  . Vitamin D, Ergocalciferol, (DRISDOL) 1.25 MG (50000 UNIT) CAPS capsule    Sig: Take 1 capsule (50,000 Units total) by mouth every 7 (seven) days.    Dispense:  8 capsule    Refill:  0    Order Specific Question:   Supervising Provider    Answer:   [id number] A Collie Siad  . cetirizine (ZYRTEC) 10 MG tablet    Sig: Take 1 tablet (10 mg total) by mouth daily.    Dispense:  30 tablet    Refill:  11    Order  Specific Question:   Supervising Provider    Answer:   [id number] A Collie Siad  . montelukast (SINGULAIR) 10 MG tablet    Sig: Take 1 tablet (10 mg total) by mouth at bedtime.    Dispense:  30 tablet    Refill:  3    Order Specific Question:   Supervising Provider  AnswerDelia Chimes A [id number]  . guaiFENesin-dextromethorphan (ROBITUSSIN DM) 100-10 MG/5ML syrup    Sig: Take 5 mLs by mouth every 4 (four) hours as needed for cough.    Dispense:  118 mL    Refill:  0    Order Specific Question:   Supervising Provider    Answer:   Forrest Moron [id number]    Follow-up: No follow-ups on file.    Maximiano Coss, NP

## 2020-04-13 NOTE — Patient Instructions (Signed)
° ° ° °  If you have lab work done today you will be contacted with your lab results within the next 2 weeks.  If you have not heard from us then please contact us. The fastest way to get your results is to register for My Chart. ° ° °IF you received an x-ray today, you will receive an invoice from [facility]. Please contact [facility] at [phone] with questions or concerns regarding your invoice.  ° °IF you received labwork today, you will receive an invoice from [facility]. Please contact [facility] at [phone] with questions or concerns regarding your invoice.  ° °Our billing staff will not be able to assist you with questions regarding bills from these companies. ° °You will be contacted with the lab results as soon as they are available. The fastest way to get your results is to activate your My Chart account. Instructions are located on the last page of this paperwork. If you have not heard from us regarding the results in 2 weeks, please contact this office. °  ° ° ° °

## 2020-04-14 ENCOUNTER — Other Ambulatory Visit: Payer: Self-pay | Admitting: Adult Health Nurse Practitioner

## 2020-04-14 MED ORDER — VITAMIN D (ERGOCALCIFEROL) 1.25 MG (50000 UNIT) PO CAPS: 50000.0000 [IU] | ORAL_CAPSULE | ORAL | 3 refills | Status: AC

## 2020-05-05 ENCOUNTER — Encounter: Payer: Self-pay | Admitting: Internal Medicine

## 2020-07-05 ENCOUNTER — Telehealth: Payer: Self-pay | Admitting: *Deleted

## 2020-07-05 NOTE — Telephone Encounter (Signed)
Not able to reach pt or leave a message.  Cancellation letter mailed and procedures cancelled

## 2020-07-05 NOTE — Telephone Encounter (Signed)
Pt NOS to PV.  Attempted to reach him by phone- no answer and mailbox full.  Will try to reach him again later today

## 2020-07-19 ENCOUNTER — Encounter: Payer: 59 | Admitting: Internal Medicine

## 2020-10-25 ENCOUNTER — Ambulatory Visit (HOSPITAL_COMMUNITY)
Admission: RE | Admit: 2020-10-25 | Discharge: 2020-10-25 | Disposition: A | Discharge status: Home / Self Care | Payer: 59 | Source: Ambulatory Visit | Attending: Urgent Care | Admitting: Urgent Care

## 2020-10-25 ENCOUNTER — Other Ambulatory Visit: Payer: Self-pay

## 2020-10-25 ENCOUNTER — Encounter (HOSPITAL_COMMUNITY): Payer: Self-pay

## 2020-10-25 VITALS — BP 140/89 | HR 62 | Temp 98.8°F

## 2020-10-25 DIAGNOSIS — R1111 Vomiting without nausea: Secondary | ICD-10-CM | POA: Diagnosis not present

## 2020-10-25 MED ORDER — OMEPRAZOLE 20 MG PO CPDR: DELAYED_RELEASE_CAPSULE | ORAL | 1 refills | Status: DC

## 2020-10-25 NOTE — ED Triage Notes (Signed)
Pt c/o nausea and like food is sitting in his chest and he can't sleep at night x 3 months. Pt states he has to stick his finger in his mouth and regurgitate his food. Pt states [place] seas and would like something for travelers diarrhea.

## 2020-10-25 NOTE — ED Provider Notes (Signed)
[facility]   MRN: [id number] DOB: 1966/07/03  Subjective:   Albert Mccormick is a 54 y.o. male presenting for 28-month history of persistent globus sensation in his throat and chest at night that elicits vomiting.  Patient states that he tries to sit up for 2 hours after dinner but regularly has a sensation that is very uncomfortable.  He ends up vomiting or making himself vomit.  Denies any other difficulty during the day.  No diarrhea, constipation, nausea.  Patient has never had to take medication for acid reflux.  Denies chest pain, shortness of breath, fevers, unexplained weight loss.  No current facility-administered medications for this encounter.  Current Outpatient Medications:  Marland Kitchen  Vitamin D, Ergocalciferol, (DRISDOL) 1.25 MG (50000 UNIT) CAPS capsule, Take 1 capsule (50,000 Units total) by mouth every 7 (seven) days., Disp: 8 capsule, Rfl: 0   No Known Allergies  History reviewed. No pertinent past medical history.   History reviewed. No pertinent surgical history.  History reviewed. No pertinent family history.  Social History   Tobacco Use  . Smoking status: Never Smoker  . Smokeless tobacco: Never Used  Substance Use Topics  . Alcohol use: No  . Drug use: No    ROS   Objective:   Vitals: BP 140/89 (BP Location: Right Arm)   Pulse 62   Temp 98.8 F (37.1 C) (Oral)   SpO2 98%   Physical Exam Constitutional:      General: He is not in acute distress.    Appearance: Normal appearance. He is well-developed and normal weight. He is not ill-appearing, toxic-appearing or diaphoretic.  HENT:     Head: Normocephalic and atraumatic.     Right Ear: External ear normal.     Left Ear: External ear normal.     Nose: Nose normal.     Mouth/Throat:     Pharynx: Oropharynx is clear.  Eyes:     General: No scleral icterus.       Right eye: No discharge.        Left eye: No discharge.     Extraocular Movements: Extraocular movements intact.      Pupils: Pupils are equal, round, and reactive to light.  Cardiovascular:     Rate and Rhythm: Normal rate and regular rhythm.     Heart sounds: No murmur heard.  No friction rub. No gallop.   Pulmonary:     Effort: Pulmonary effort is normal. No respiratory distress.     Breath sounds: No wheezing or rales.  Abdominal:     General: Bowel sounds are normal. There is no distension.     Palpations: Abdomen is soft. There is no mass.     Tenderness: There is no abdominal tenderness. There is no guarding or rebound.  Musculoskeletal:     Cervical back: Normal range of motion.  Skin:    General: Skin is warm and dry.  Neurological:     Mental Status: He is alert and oriented to person, place, and time.  Psychiatric:        Mood and Affect: Mood normal.        Behavior: Behavior normal.        Thought Content: Thought content normal.        Judgment: Judgment normal.     Assessment and Plan :   PDMP not reviewed this encounter.  1. Vomiting without nausea, intractability of vomiting not specified, unspecified vomiting type     Referral to GI  pending for consideration of an endoscopy.  In the meantime, start omeprazole 1 hour before dinner every day. Counseled patient on potential for adverse effects with medications prescribed/recommended today, ER and return-to-clinic precautions discussed, patient verbalized understanding.    Wallis Bamberg, PA-C 10/25/20 1930

## 2021-03-28 ENCOUNTER — Other Ambulatory Visit: Payer: Self-pay

## 2021-03-28 ENCOUNTER — Ambulatory Visit
Admission: EM | Admit: 2021-03-28 | Discharge: 2021-03-28 | Disposition: A | Discharge status: Home / Self Care | Payer: 59 | Attending: Emergency Medicine | Admitting: Emergency Medicine

## 2021-03-28 DIAGNOSIS — R5383 Other fatigue: Secondary | ICD-10-CM | POA: Diagnosis not present

## 2021-03-28 MED ORDER — OMEPRAZOLE 20 MG PO CPDR: 20.0000 mg | DELAYED_RELEASE_CAPSULE | Freq: Two times a day (BID) | ORAL | 0 refills | Status: DC

## 2021-03-28 MED ORDER — NAPROXEN 500 MG PO TABS: 500.0000 mg | ORAL_TABLET | Freq: Two times a day (BID) | ORAL | 0 refills | Status: DC

## 2021-03-28 NOTE — Discharge Instructions (Addendum)
Blood work pending Please establish care with primary care Naprosyn twice daily for body aches, finger pain as needed Ice finger as needed after work Begin omeprazole twice daily before meals Follow-up if not improving or worsening

## 2021-03-28 NOTE — ED Triage Notes (Addendum)
Pt c/o body aches/tightness, dizziness, and fatigue for over a week. Pt requesting refill on his Prilosec.

## 2021-03-30 LAB — CBC
Hematocrit: 50.8 % (ref 37.5–51.0)
Hemoglobin: 17.2 g/dL (ref 13.0–17.7)
MCH: 30.7 pg (ref 26.6–33.0)
MCHC: 33.9 g/dL (ref 31.5–35.7)
MCV: 91 fL (ref 79–97)
Platelets: 199 10*3/uL (ref 150–450)
RBC: 5.61 x10E6/uL (ref 4.14–5.80)
RDW: 14.2 % (ref 11.6–15.4)
WBC: 4.9 10*3/uL (ref 3.4–10.8)

## 2021-03-30 LAB — VITAMIN D 25 HYDROXY (VIT D DEFICIENCY, FRACTURES): Vit D, 25-Hydroxy: 15.1 ng/mL — ABNORMAL LOW (ref 30.0–100.0)

## 2021-03-30 LAB — COMPREHENSIVE METABOLIC PANEL
ALT: 27 IU/L (ref 0–44)
AST: 17 IU/L (ref 0–40)
Albumin/Globulin Ratio: 1.3 (ref 1.2–2.2)
Albumin: 4.2 g/dL (ref 3.8–4.9)
Alkaline Phosphatase: 82 IU/L (ref 44–121)
BUN/Creatinine Ratio: 14 (ref 9–20)
BUN: 15 mg/dL (ref 6–24)
Bilirubin Total: <0.2 mg/dL (ref 0.0–1.2)
CO2: 20 mmol/L (ref 20–29)
Calcium: 9.3 mg/dL (ref 8.7–10.2)
Chloride: 101 mmol/L (ref 96–106)
Creatinine, Ser: 1.1 mg/dL (ref 0.76–1.27)
Globulin, Total: 3.2 g/dL (ref 1.5–4.5)
Glucose: 93 mg/dL (ref 65–99)
Potassium: 4.6 mmol/L (ref 3.5–5.2)
Sodium: 138 mmol/L (ref 134–144)
Total Protein: 7.4 g/dL (ref 6.0–8.5)
eGFR: 79 mL/min/{1.73_m2} (ref >59)

## 2021-03-30 LAB — TSH: TSH: 2.08 u[IU]/mL (ref 0.450–4.500)

## 2021-03-30 NOTE — ED Provider Notes (Signed)
[facility]    CSN: [id number] Arrival date & time: 03/28/21  1732      History   Chief Complaint Chief Complaint  Patient presents with  . Generalized Body Aches    HPI Albert Mccormick is a 55 y.o. male presenting today for evaluation of body aches and fatigue.  Patient reports over the past week he has had cramping in his muscles while at work and generalized body aches.  He denies feeling feverish or any URI symptoms.  Denies GI symptoms.  Has had generalized fatigue.  Does have history of vitamin D deficiency, currently not on any supplementation.  HPI  History reviewed. No pertinent past medical history.  Patient Active Problem List   Diagnosis Date Noted  . Effusion of finger joint 03/31/2020  . Fatigue 03/31/2020  . Colon cancer screening 03/31/2020  . Encounter for vitamin deficiency screening 03/31/2020  . Prostate cancer screening 03/31/2020    History reviewed. No pertinent surgical history.     Home Medications    Prior to Admission medications   Medication Sig Start Date End Date Taking? Authorizing Provider  naproxen (NAPROSYN) 500 MG tablet Take 1 tablet (500 mg total) by mouth 2 (two) times daily. 03/28/21  Yes Indiya Izquierdo C, PA-C  omeprazole (PRILOSEC) 20 MG capsule Take 1 capsule (20 mg total) by mouth 2 (two) times daily before a meal. Take 1 capsule 1 hour before dinner daily. 03/28/21   Damareon Lanni C, PA-C  Vitamin D, Ergocalciferol, (DRISDOL) 1.25 MG (50000 UNIT) CAPS capsule Take 1 capsule (50,000 Units total) by mouth every 7 (seven) days. 04/13/20   Janeece Agee, NP  cetirizine (ZYRTEC) 10 MG tablet Take 1 tablet (10 mg total) by mouth daily. 04/13/20 10/25/20  Janeece Agee, NP  montelukast (SINGULAIR) 10 MG tablet Take 1 tablet (10 mg total) by mouth at bedtime. 04/13/20 10/25/20  Janeece Agee, NP    Family History History reviewed. No pertinent family history.  Social History Social History   Tobacco Use  .  Smoking status: Never Smoker  . Smokeless tobacco: Never Used  Substance Use Topics  . Alcohol use: No  . Drug use: No     Allergies   Patient has no known allergies.   Review of Systems Review of Systems  Constitutional: Positive for fatigue. Negative for fever.  HENT: Negative for congestion, sinus pressure and sore throat.   Eyes: Negative for photophobia, pain and visual disturbance.  Respiratory: Negative for cough and shortness of breath.   Cardiovascular: Negative for chest pain.  Gastrointestinal: Negative for abdominal pain, nausea and vomiting.  Genitourinary: Negative for decreased urine volume and hematuria.  Musculoskeletal: Positive for myalgias and neck pain. Negative for neck stiffness.  Neurological: Negative for dizziness, syncope, facial asymmetry, speech difficulty, weakness, light-headedness, numbness and headaches.     Physical Exam Triage Vital Signs ED Triage Vitals  Enc Vitals Group     BP 03/28/21 1913 130/80     Pulse Rate 03/28/21 1913 85     Resp 03/28/21 1913 18     Temp 03/28/21 1913 99.1 F (37.3 C)     Temp Source 03/28/21 1913 Oral     SpO2 03/28/21 1913 95 %     Weight --      Height --      Head Circumference --      Peak Flow --      Pain Score 03/28/21 1921 0     Pain Loc --  Pain Edu? --      Excl. in [place]? --    No data found.  Updated Vital Signs BP 130/80 (BP Location: Left Arm)   Pulse 85   Temp 99.1 F (37.3 C) (Oral)   Resp 18   SpO2 95%   Visual Acuity Right Eye Distance:   Left Eye Distance:   Bilateral Distance:    Right Eye Near:   Left Eye Near:    Bilateral Near:     Physical Exam Vitals and nursing note reviewed.  Constitutional:      Appearance: He is well-developed.     Comments: No acute distress  HENT:     Head: Normocephalic and atraumatic.     Nose: Nose normal.  Eyes:     Conjunctiva/sclera: Conjunctivae normal.  Cardiovascular:     Rate and Rhythm: Normal rate and regular rhythm.   Pulmonary:     Effort: Pulmonary effort is normal. No respiratory distress.     Comments: Breathing comfortably at rest, CTABL, no wheezing, rales or other adventitious sounds auscultated  Abdominal:     General: There is no distension.  Musculoskeletal:        General: Normal range of motion.     Cervical back: Neck supple.  Skin:    General: Skin is warm and dry.  Neurological:     General: No focal deficit present.     Mental Status: He is alert and oriented to person, place, and time. Mental status is at baseline.     Cranial Nerves: No cranial nerve deficit.     Motor: No weakness.      UC Treatments / Results  Labs (all labs ordered are listed, but only abnormal results are displayed) Labs Reviewed  VITAMIN D 25 HYDROXY (VIT D DEFICIENCY, FRACTURES) - Abnormal; Notable for the following components:      Result Value   Vit D, 25-Hydroxy 15.1 (*)    All other components within normal limits   Narrative:    Performed at:  [address], [place], [place]  [id number] Lab Director: Jolene Schimke MD, Phone:  [phone]  CBC   Narrative:    Performed at:  [address] [place] [address], [place], [place]  [id number] Lab Director: Jolene Schimke MD, Phone:  [phone]  COMPREHENSIVE METABOLIC PANEL   Narrative:    Performed at:  [address] [place], [place]  [id number] Lab Director: Jolene Schimke MD, Phone:  [phone]  TSH   Narrative:    Performed at:  [address] [place] [address], [place], [place]  [id number] Lab Director: Jolene Schimke MD, Phone:  [phone]    EKG   Radiology No results found.  Procedures Procedures (including critical care time)  Medications Ordered in UC Medications - No data to display  Initial Impression / Assessment and Plan / UC Course  I have reviewed the triage vital signs and the nursing notes.  Pertinent labs & imaging results that were available during  my care of the patient were reviewed by me and considered in my medical decision making (see chart for details).     Generalized fatigue and associated body aches-we will check basic labs, including TSH and vitamin D.  Will call with results of abnormal.  Encouraged to establish care with primary care for further evaluation and treatment of any underlying deficiencies.  Declined COVID testing.  Naprosyn as needed for body aches, refilled omeprazole.  Discussed strict return  precautions. Patient verbalized understanding and is agreeable with plan.  Final Clinical Impressions(s) / UC Diagnoses   Final diagnoses:  Fatigue, unspecified type     Discharge Instructions     Blood work pending Please establish care with primary care Naprosyn twice daily for body aches, finger pain as needed Ice finger as needed after work Begin omeprazole twice daily before meals Follow-up if not improving or worsening   ED Prescriptions    Medication Sig Dispense Auth. Provider   omeprazole (PRILOSEC) 20 MG capsule Take 1 capsule (20 mg total) by mouth 2 (two) times daily before a meal. Take 1 capsule 1 hour before dinner daily. 60 capsule Ivah Girardot C, PA-C   naproxen (NAPROSYN) 500 MG tablet Take 1 tablet (500 mg total) by mouth 2 (two) times daily. 30 tablet Jasha Hodzic, [place] C, PA-C     PDMP not reviewed this encounter.   Lew Dawes, [place] 03/30/21 [phone]

## 2021-04-26 ENCOUNTER — Telehealth: Payer: Self-pay | Admitting: Emergency Medicine

## 2021-04-26 ENCOUNTER — Ambulatory Visit
Admission: EM | Admit: 2021-04-26 | Discharge: 2021-04-26 | Disposition: A | Discharge status: Home / Self Care | Payer: 59 | Attending: Emergency Medicine | Admitting: Emergency Medicine

## 2021-04-26 ENCOUNTER — Other Ambulatory Visit: Payer: Self-pay

## 2021-04-26 ENCOUNTER — Encounter: Payer: Self-pay | Admitting: Emergency Medicine

## 2021-04-26 DIAGNOSIS — M7551 Bursitis of right shoulder: Secondary | ICD-10-CM | POA: Diagnosis not present

## 2021-04-26 MED ORDER — MELOXICAM 15 MG PO TABS: 15.0000 mg | ORAL_TABLET | Freq: Every day | ORAL | 0 refills | Status: AC

## 2021-04-26 MED ORDER — MELOXICAM 15 MG PO TABS: 15.0000 mg | ORAL_TABLET | Freq: Every day | ORAL | 0 refills | Status: DC

## 2021-04-26 NOTE — Discharge Instructions (Addendum)
I suspect you have shoulder bursitis.  Take the Mobic in the morning with breakfast with 1000 mg of Tylenol.  Take an additional 1000 mg of Tylenol at lunch, dinner, at bedtime if your shoulder still bothers you.  Ice or heat, whichever feels better.  Sleep with a pillow between your body and your arm at night to help decompress your shoulder.  Follow-up with Dr. Dion Saucier, Dr. Rennis Chris or with [facility] sports medicine if you are not better in a week.  Below is a list of primary care practices who are taking new patients for you to follow-up with.  [facility] internal medicine clinic Ground Floor - [facility], [address] [place], [place], [place] [address] [phone]  [facility] Primary Care at [facility] [address] [place], [place] [address] [phone]  [facility] [address] [place], [place] [address] [phone]  [name] [phone] [address] [place] [address]  [name]: [address] [place] [address]  [phone]  [facility]: [address] [place] [address]   [phone]  [facility] Family Medicine: [address] [place] [address]  [phone]  [place] primary [facility] : [address] [place] [address] [phone]  [facility] Primary Care: [address] [place] [address] [phone]  [facility]: [address] [place] [address] [phone]  Dr. Oneal Grout [address] [facility] [place] [address]  [phone]  Go to www.goodrx.com  or www.costplusdrugs.com to look up your medications. This will give you a list of where you can find your prescriptions at the most affordable prices. Or ask the pharmacist what the cash  price is, or if they have any other discount programs available to help make your medication more affordable. This can be less expensive than what you would pay with insurance.

## 2021-04-26 NOTE — ED Provider Notes (Signed)
HPI  SUBJECTIVE:  Gavan Nordby is a right-handed 55 y.o. male who presents with right shoulder pain "in the joint" that occasionally radiates down his arm for the past week.  He describes the pain as soreness, intermittent, lasting hours.  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  No other repetitive motions.  He states that he works 6 days a week, no recent increase in hours.  No numbness or tingling, weakness, limitation of motion, direct trauma, fevers, erythema, swelling.  He states the pain is waking him up at night.  He has tried Aleve and ibuprofen without improvement in symptoms. symptoms are worse with putting pressure on the shoulder joint.  He has never had symptoms like this before.  Past medical history negative for right shoulder injury, chronic kidney disease, diabetes, hypertension.  PMD: None.  History reviewed. No pertinent past medical history.  History reviewed. No pertinent surgical history.  History reviewed. No pertinent family history.  Social History   Tobacco Use  . Smoking status: Never Smoker  . Smokeless tobacco: Never Used  Substance Use Topics  . Alcohol use: No  . Drug use: No    No current facility-administered medications for this encounter.  Current Outpatient Medications:  .  meloxicam (MOBIC) 15 MG tablet, Take 1 tablet (15 mg total) by mouth daily for 7 days., Disp: 7 tablet, Rfl: 0 .  omeprazole (PRILOSEC) 20 MG capsule, Take 1 capsule (20 mg total) by mouth 2 (two) times daily before a meal. Take 1 capsule 1 hour before dinner daily., Disp: 60 capsule, Rfl: 0 .  Vitamin D, Ergocalciferol, (DRISDOL) 1.25 MG (50000 UNIT) CAPS capsule, Take 1 capsule (50,000 Units total) by mouth every 7 (seven) days., Disp: 8 capsule, Rfl: 0  No Known Allergies   ROS  As noted in HPI.   Physical Exam  BP 127/71 (BP Location: Right Arm)   Pulse 70   Temp 98.6 F (37 C) (Oral)   Resp 16   SpO2 97%    Constitutional: Well developed, well nourished, no acute distress Eyes:  EOMI, conjunctiva normal bilaterally HENT: Normocephalic, atraumatic,mucus membranes moist Respiratory: Normal inspiratory effort Cardiovascular: Normal rate GI: nondistended skin: No rash, skin intact Musculoskeletal: no deformities. Right shoulder with ROM normal , Drop test normal ,  clavicle NT, A/C joint NT, scapula NT, proximal humerus NT, shoulder joint NT, Motor strength normal , Sensation intact LT over deltoid region, distal NVI with hand  having intact sensation and strength in the distribution of the median, radial, and ulnar nerve.  RP 2+.  no pain with internal rotation,  no pain with external rotation,  negative tenderness in bicipital groove, negative empty can, liftoff test  Neurologic: Alert & oriented x 3, no focal neuro deficits Psychiatric: Speech and behavior appropriate   ED Course   Medications - No data to display  No orders of the defined types were placed in this encounter.   No results found for this or any previous visit (from the past 24 hour(s)). No results found.  ED Clinical Impression  1. Bursitis of right shoulder      ED Assessment/Plan  Suspect bursitis as patient does a lot of repetitive overhead activity going up and down a ladder.  Will send home with Mobic/Tylenol, advised patient to sleep with a pillow between his body and his arm at night, relative rest, follow-up with Dr. Rennis Chris, Dr. Dion Saucier, or [facility] sports medicine if not  better with conservative treatment in a week or 2.  Also providing primary care list for routine care and order assistance in finding a PMD.  Discussed MDM, treatment plan, and plan for follow-up with patient.. patient agrees with plan.   Meds ordered this encounter  Medications  . meloxicam (MOBIC) 15 MG tablet    Sig: Take 1 tablet (15 mg total) by mouth daily for 7 days.    Dispense:  7 tablet    Refill:  0      *This clinic note  was created using Scientist, clinical (histocompatibility and immunogenetics). Therefore, there may be occasional mistakes despite careful proofreading.  ?    Domenick Gong, MD 04/27/21 [phone]

## 2021-04-26 NOTE — ED Triage Notes (Signed)
Pt said he noticed his right shoulder becoming sore and stiff x week. Been taking ibuprofen and Aleve with no relief.No injury.

## 2023-01-26 ENCOUNTER — Ambulatory Visit
Admission: EM | Admit: 2023-01-26 | Discharge: 2023-01-26 | Disposition: A | Discharge status: Home / Self Care | Payer: 59 | Attending: Internal Medicine | Admitting: Internal Medicine

## 2023-01-26 DIAGNOSIS — M79605 Pain in left leg: Secondary | ICD-10-CM

## 2023-01-26 HISTORY — DX: Gastro-esophageal reflux disease without esophagitis: K21.9

## 2023-01-26 MED ORDER — CYCLOBENZAPRINE HCL 5 MG PO TABS: 5.0000 mg | ORAL_TABLET | Freq: Two times a day (BID) | ORAL | 0 refills | Status: DC | PRN

## 2023-01-26 MED ORDER — PREDNISONE 20 MG PO TABS: 40.0000 mg | ORAL_TABLET | Freq: Every day | ORAL | 0 refills | Status: AC

## 2023-01-26 NOTE — ED Triage Notes (Signed)
Pt c/o left leg pain around his upper left leg that causes LROM denies known strenuous activity or physical injury. Onset ~ 2 weeks ago

## 2023-01-26 NOTE — ED Provider Notes (Signed)
[facility]    CSN: [id number] Arrival date & time: 01/26/23  1216      History   Chief Complaint Chief Complaint  Patient presents with   left leg pain    HPI Albert Mccormick is a 57 y.o. male.   Patient presents with left posterior thigh pain that started about 2 weeks ago.  He denies any obvious injury to the area.  Denies history of chronic leg pain.  Patient reports pain only occurs with flexion of the knee.  Denies numbness or tingling.  Has taken Tylenol for pain with minimal improvement.  Denies any prolonged sitting for travel in car or plane recently.     Past Medical History:  Diagnosis Date   Acid reflux     Patient Active Problem List   Diagnosis Date Noted   Effusion of finger joint 03/31/2020   Fatigue 03/31/2020   Colon cancer screening 03/31/2020   Encounter for vitamin deficiency screening 03/31/2020   Prostate cancer screening 03/31/2020    History reviewed. No pertinent surgical history.     Home Medications    Prior to Admission medications   Medication Sig Start Date End Date Taking? Authorizing Provider  cyclobenzaprine (FLEXERIL) 5 MG tablet Take 1 tablet (5 mg total) by mouth 2 (two) times daily as needed for muscle spasms. 01/26/23  Yes Whitten Andreoni, Hildred Alamin E, FNP  predniSONE (DELTASONE) 20 MG tablet Take 2 tablets (40 mg total) by mouth daily for 5 days. 01/26/23 01/31/23 Yes Kache Mcclurg, Michele Rockers, FNP  omeprazole (PRILOSEC) 20 MG capsule Take 1 capsule (20 mg total) by mouth 2 (two) times daily before a meal. Take 1 capsule 1 hour before dinner daily. 03/28/21   Wieters, Hallie C, PA-C  Vitamin D, Ergocalciferol, (DRISDOL) 1.25 MG (50000 UNIT) CAPS capsule Take 1 capsule (50,000 Units total) by mouth every 7 (seven) days. 04/13/20   Maximiano Coss, NP  cetirizine (ZYRTEC) 10 MG tablet Take 1 tablet (10 mg total) by mouth daily. 04/13/20 10/25/20  Maximiano Coss, NP  montelukast (SINGULAIR) 10 MG tablet Take 1 tablet (10 mg total) by mouth at  bedtime. 04/13/20 10/25/20  Maximiano Coss, NP    Family History History reviewed. No pertinent family history.  Social History Social History   Tobacco Use   Smoking status: Never   Smokeless tobacco: Never  Substance Use Topics   Alcohol use: No   Drug use: No     Allergies   Patient has no known allergies.   Review of Systems Review of Systems Per HPI  Physical Exam Triage Vital Signs ED Triage Vitals  Enc Vitals Group     BP 01/26/23 1305 (!) 145/81     Pulse Rate 01/26/23 1305 66     Resp 01/26/23 1305 16     Temp 01/26/23 1305 97.6 F (36.4 C)     Temp Source 01/26/23 1305 Oral     SpO2 01/26/23 1305 98 %     Weight --      Height --      Head Circumference --      Peak Flow --      Pain Score 01/26/23 1306 10     Pain Loc --      Pain Edu? --      Excl. in [place]? --    No data found.  Updated Vital Signs BP (!) 145/81 (BP Location: Left Arm)   Pulse 66   Temp 97.6 F (36.4 C) (Oral)   Resp 16  SpO2 98%   Visual Acuity Right Eye Distance:   Left Eye Distance:   Bilateral Distance:    Right Eye Near:   Left Eye Near:    Bilateral Near:     Physical Exam Constitutional:      General: He is not in acute distress.    Appearance: Normal appearance. He is not toxic-appearing or diaphoretic.  HENT:     Head: Normocephalic and atraumatic.  Eyes:     Extraocular Movements: Extraocular movements intact.     Conjunctiva/sclera: Conjunctivae normal.  Pulmonary:     Effort: Pulmonary effort is normal.  Musculoskeletal:       Legs:     Comments: Pain occurs with flexion of the knee at the left posterior lower thigh.  No tenderness to palpation.  No swelling or discoloration.  Pulses and capillary refill are intact.  Patient has full range of motion of leg.  Patient can bear weight.  Neurological:     General: No focal deficit present.     Mental Status: He is alert and oriented to person, place, and time. Mental status is at baseline.   Psychiatric:        Mood and Affect: Mood normal.        Behavior: Behavior normal.        Thought Content: Thought content normal.        Judgment: Judgment normal.      UC Treatments / Results  Labs (all labs ordered are listed, but only abnormal results are displayed) Labs Reviewed - No data to display  EKG   Radiology No results found.  Procedures Procedures (including critical care time)  Medications Ordered in UC Medications - No data to display  Initial Impression / Assessment and Plan / UC Course  I have reviewed the triage vital signs and the nursing notes.  Pertinent labs & imaging results that were available during my care of the patient were reviewed by me and considered in my medical decision making (see chart for details).     Suspect muscular strain/injury of posterior thigh.  Possible hamstring inflammation.  No concern for DVT at this time.  Will prescribe prednisone and muscle relaxer.  Advised patient to take medication with food and that muscle relaxer can make him drowsy.  Advised to not drive or drink alcohol with taking medication.  Discussed supportive care and following up with provided contact information for orthopedist if symptoms persist or worsen.  Patient verbalized understanding and was agreeable with plan. Final Clinical Impressions(s) / UC Diagnoses   Final diagnoses:  Left leg pain     Discharge Instructions      Suspect you have a muscle strain.  I have prescribed prednisone and a muscle relaxer.  Please be advised that muscle relaxer can make you drowsy.  Do not drive, operate heavy machinery, drink alcohol with taking medication.  Follow-up with orthopedist at provided contact information if symptoms persist or worsen.    ED Prescriptions     Medication Sig Dispense Auth. Provider   predniSONE (DELTASONE) 20 MG tablet Take 2 tablets (40 mg total) by mouth daily for 5 days. 10 tablet [place], [place] E, [place]   cyclobenzaprine  (FLEXERIL) 5 MG tablet Take 1 tablet (5 mg total) by mouth 2 (two) times daily as needed for muscle spasms. 20 tablet [place], Michele Rockers, [place]      PDMP not reviewed this encounter.   Teodora Medici, [place] 01/26/23 [phone]

## 2023-01-26 NOTE — Discharge Instructions (Signed)
Suspect you have a muscle strain.  I have prescribed prednisone and a muscle relaxer.  Please be advised that muscle relaxer can make you drowsy.  Do not drive, operate heavy machinery, drink alcohol with taking medication.  Follow-up with orthopedist at provided contact information if symptoms persist or worsen.

## 2023-09-28 ENCOUNTER — Other Ambulatory Visit: Payer: Self-pay

## 2023-09-28 ENCOUNTER — Ambulatory Visit
Admission: EM | Admit: 2023-09-28 | Discharge: 2023-09-28 | Disposition: A | Discharge status: Home / Self Care | Payer: 59 | Attending: Physician Assistant | Admitting: Physician Assistant

## 2023-09-28 DIAGNOSIS — Z1152 Encounter for screening for COVID-19: Secondary | ICD-10-CM | POA: Insufficient documentation

## 2023-09-28 DIAGNOSIS — J069 Acute upper respiratory infection, unspecified: Secondary | ICD-10-CM | POA: Insufficient documentation

## 2023-09-28 LAB — POCT INFLUENZA A/B
Influenza A, POC: NEGATIVE
Influenza B, POC: NEGATIVE

## 2023-09-28 NOTE — ED Provider Notes (Signed)
[facility]    CSN: [id number] Arrival date & time: 09/28/23  1221      History   Chief Complaint Chief Complaint  Patient presents with   Flu-Like Symptoms    HPI Albert Mccormick is a 57 y.o. male.   Patient here today for evaluation of congestion, fatigue, body aches that started a few days ago.  He reports that he has been traveling recently and is unsure if this is related.  He denies any vomiting or diarrhea.  He has taken over-the-counter medication without resolution.  The history is provided by the patient.    Past Medical History:  Diagnosis Date   Acid reflux     Patient Active Problem List   Diagnosis Date Noted   Effusion of finger joint 03/31/2020   Fatigue 03/31/2020   Colon cancer screening 03/31/2020   Encounter for vitamin deficiency screening 03/31/2020   Prostate cancer screening 03/31/2020    History reviewed. No pertinent surgical history.     Home Medications    Prior to Admission medications   Medication Sig Start Date End Date Taking? Authorizing Provider  cetirizine (ZYRTEC) 10 MG tablet Take 10 mg by mouth daily. 04/13/20  Yes [provider]  dextromethorphan-guaiFENesin (ROBITUSSIN-DM) 10-100 MG/5ML liquid Take 5 mLs by mouth every 6 (six) hours as needed for cough. 04/13/20  Yes [provider]  doxycycline (VIBRAMYCIN) 100 MG capsule Take 100 mg by mouth 2 (two) times daily. 03/01/16  Yes [provider]  montelukast (SINGULAIR) 10 MG tablet Take 10 mg by mouth at bedtime. 04/13/20  Yes [provider]  Pseudoeph-Doxylamine-DM-APAP (NYQUIL PO) Take by mouth.   Yes [provider]  Pseudoephedrine-APAP-DM (DAYQUIL PO) Take by mouth.   Yes [provider]  cyclobenzaprine (FLEXERIL) 5 MG tablet Take 1 tablet (5 mg total) by mouth 2 (two) times daily as needed for muscle spasms. 01/26/23   Gustavus Bryant, FNP  omeprazole (PRILOSEC) 20 MG capsule Take 1 capsule (20 mg total)  by mouth 2 (two) times daily before a meal. Take 1 capsule 1 hour before dinner daily. 03/28/21   Wieters, Hallie C, PA-C  Vitamin D, Ergocalciferol, (DRISDOL) 1.25 MG (50000 UNIT) CAPS capsule Take 1 capsule (50,000 Units total) by mouth every 7 (seven) days. 04/13/20   Janeece Agee, NP    Family History History reviewed. No pertinent family history.  Social History Social History   Tobacco Use   Smoking status: Never    Passive exposure: Never   Smokeless tobacco: Never  Vaping Use   Vaping status: Never Used  Substance Use Topics   Alcohol use: No   Drug use: Never     Allergies   Patient has no known allergies.   Review of Systems Review of Systems  Constitutional:  Positive for fatigue. Negative for fever.  HENT:  Positive for congestion, rhinorrhea and sore throat. Negative for ear pain.   Eyes:  Negative for discharge and redness.  Respiratory:  Positive for cough. Negative for shortness of breath.   Gastrointestinal:  Negative for nausea and vomiting.     Physical Exam Triage Vital Signs ED Triage Vitals  Encounter Vitals Group     BP 09/28/23 1241 118/72     Systolic BP Percentile --      Diastolic BP Percentile --      Pulse Rate 09/28/23 1241 72     Resp 09/28/23 1241 18     Temp 09/28/23 1241 98.5 F (36.9 C)  Temp Source 09/28/23 1241 Oral     SpO2 09/28/23 1241 97 %     Weight 09/28/23 1239 200 lb (90.7 kg)     Height 09/28/23 1239 5\' 7"  (1.702 m)     Head Circumference --      Peak Flow --      Pain Score 09/28/23 1236 0     Pain Loc --      Pain Education --      Exclude from Growth Chart --    No data found.  Updated Vital Signs BP 118/72 (BP Location: Left Arm)   Pulse 72   Temp 98.5 F (36.9 C) (Oral)   Resp 18   Ht 5\' 7"  (1.702 m)   Wt 200 lb (90.7 kg)   SpO2 97%   BMI 31.32 kg/m      Physical Exam Vitals and nursing note reviewed.  Constitutional:      General: He is not in acute distress.    Appearance: He is  well-developed. He is not ill-appearing.  HENT:     Head: Normocephalic and atraumatic.     Right Ear: Tympanic membrane normal.     Left Ear: Tympanic membrane normal.     Nose: Congestion present.     Mouth/Throat:     Mouth: Mucous membranes are moist.     Pharynx: Posterior oropharyngeal erythema present. No oropharyngeal exudate.     Tonsils: 0 on the right. 0 on the left.  Eyes:     Conjunctiva/sclera: Conjunctivae normal.  Cardiovascular:     Rate and Rhythm: Normal rate and regular rhythm.     Heart sounds: Normal heart sounds. No murmur heard. Pulmonary:     Effort: Pulmonary effort is normal. No respiratory distress.     Breath sounds: Normal breath sounds. No wheezing, rhonchi or rales.  Skin:    General: Skin is warm and dry.  Neurological:     Mental Status: He is alert.  Psychiatric:        Mood and Affect: Mood normal.        Behavior: Behavior normal.      UC Treatments / Results  Labs (all labs ordered are listed, but only abnormal results are displayed) Labs Reviewed  POCT INFLUENZA A/B - Normal  SARS CORONAVIRUS 2 (TAT 6-24 HRS)    EKG   Radiology No results found.  Procedures Procedures (including critical care time)  Medications Ordered in UC Medications - No data to display  Initial Impression / Assessment and Plan / UC Course  I have reviewed the triage vital signs and the nursing notes.  Pertinent labs & imaging results that were available during my care of the patient were reviewed by me and considered in my medical decision making (see chart for details).    Suspect viral etiology of symptoms.  Flu screening negative will screen for COVID.  Recommended symptomatic treatment, increase fluids and rest while awaiting results and follow-up with any worsening or persistent symptoms.  Final Clinical Impressions(s) / UC Diagnoses   Final diagnoses:  Acute upper respiratory infection   Discharge Instructions   None    ED  Prescriptions   None    PDMP not reviewed this encounter.   Tomi Bamberger, PA-C 09/28/23 1454

## 2023-09-28 NOTE — ED Triage Notes (Signed)
"  I look like I have been beaten up, [place], I am not sure If I have caught Malaria or something, I am having a lot of Fatigue, Runny nose, Smelling problems, I may even have the Flu". No fever known. No exposure to any known illness.

## 2023-09-29 LAB — SARS CORONAVIRUS 2 (TAT 6-24 HRS): SARS Coronavirus 2: NEGATIVE

## 2023-11-15 ENCOUNTER — Ambulatory Visit
Admission: EM | Admit: 2023-11-15 | Discharge: 2023-11-15 | Disposition: A | Discharge status: Home / Self Care | Payer: 59 | Attending: Emergency Medicine | Admitting: Emergency Medicine

## 2023-11-15 DIAGNOSIS — H6123 Impacted cerumen, bilateral: Secondary | ICD-10-CM | POA: Diagnosis not present

## 2023-11-15 DIAGNOSIS — J309 Allergic rhinitis, unspecified: Secondary | ICD-10-CM

## 2023-11-15 MED ORDER — MONTELUKAST SODIUM 10 MG PO TABS: 10.0000 mg | ORAL_TABLET | Freq: Every day | ORAL | 1 refills | Status: AC

## 2023-11-15 MED ORDER — MOMETASONE FUROATE 50 MCG/ACT NA SUSP: 2.0000 | Freq: Every day | NASAL | 2 refills | Status: AC

## 2023-11-15 MED ORDER — LEVOCETIRIZINE DIHYDROCHLORIDE 5 MG PO TABS: 5.0000 mg | ORAL_TABLET | Freq: Every evening | ORAL | 1 refills | Status: AC

## 2023-11-15 NOTE — ED Triage Notes (Addendum)
Patient reports cough x 1 week, shivering, runny nose. Treated with Dayquil and Nyquil without relief. Patient states it has been difficult to work being sick.

## 2023-11-15 NOTE — Discharge Instructions (Signed)
Your symptoms and my physical exam findings are concerning for exacerbation of your underlying allergies.     Please read below to learn more about the medications, dosages and frequencies that I recommend to help alleviate your symptoms and to get you feeling better soon:   Xyzal (levocetirizine): This is an excellent second-generation antihistamine that helps to reduce respiratory inflammatory response to environmental allergens.  In some patients, this medication can cause daytime sleepiness so I recommend that you take 1 tablet daily at bedtime.    Singulair (montelukast): This is a mast cell stabilizer that works well with antihistamines.  Mast cells are responsible for stimulating histamine production.  Reducing the activity of mast cells decreases the amount of histamines will be produced.  This reduces upper and lower respiratory inflammation caused by allergy exposure.  I recommend that you take this medication at the same time you take your antihistamine.   Nasonex (mometasone): This is a steroid nasal spray that used once daily, 1 spray in each nare.  This works best when used on a daily basis. This medication does not work well if it is only used when you think you need it.  After 3 to 5 days of use, you will notice significant reduction of the inflammation and mucus production that is currently being caused by exposure to allergens, whether seasonal or environmental.  The most common side effect of this medication is nosebleeds.  If you experience a nosebleed, please discontinue use for 1 week, then feel free to resume.   If you find that your insurance will not pay for this medication, please consider a different nasal steroids such as Flonase (fluticasone), or Nasacort (triamcinolone).    If symptoms have not meaningfully improved in the next 5 to 7 days, please return for repeat evaluation or follow-up with your regular provider.  If symptoms have worsened in the next 3 to 5 days, please  return for repeat evaluation or follow-up with your regular provider.    Thank you for visiting urgent care today.  We appreciate the opportunity to participate in your care.

## 2023-11-15 NOTE — ED Provider Notes (Signed)
[facility]    CSN: [id number] Arrival date & time: 11/15/23  1007    HISTORY   Chief Complaint  Patient presents with   Cough   HPI Albert Mccormick is a pleasant, 57 y.o. male who presents to urgent care today. Patient complains of 1 week history of cough, shivering, runny nose.  Patient denies fever, body aches, chills, known sick contacts, nausea, vomiting, diarrhea.  Patient has been prescribed allergy medications in the past, states currently not taking any of them at this time.  Patient states has been taking DayQuil and NyQuil without meaningful relief of the symptoms.  Patient states has been difficult working while he has been sick.  Patient has normal vital signs on arrival today.  The history is provided by the patient.   Past Medical History:  Diagnosis Date   Acid reflux    Patient Active Problem List   Diagnosis Date Noted   Effusion of finger joint 03/31/2020   Fatigue 03/31/2020   Colon cancer screening 03/31/2020   Encounter for vitamin deficiency screening 03/31/2020   Prostate cancer screening 03/31/2020   History reviewed. No pertinent surgical history.  Home Medications    Prior to Admission medications   Medication Sig Start Date End Date Taking? Authorizing Provider  cetirizine (ZYRTEC) 10 MG tablet Take 10 mg by mouth daily. 04/13/20   [provider]  cyclobenzaprine (FLEXERIL) 5 MG tablet Take 1 tablet (5 mg total) by mouth 2 (two) times daily as needed for muscle spasms. 01/26/23   Gustavus Bryant, FNP  dextromethorphan-guaiFENesin (ROBITUSSIN-DM) 10-100 MG/5ML liquid Take 5 mLs by mouth every 6 (six) hours as needed for cough. 04/13/20   [provider]  doxycycline (VIBRAMYCIN) 100 MG capsule Take 100 mg by mouth 2 (two) times daily. 03/01/16   [provider]  montelukast (SINGULAIR) 10 MG tablet Take 10 mg by mouth at bedtime. 04/13/20   [provider]  omeprazole (PRILOSEC) 20 MG capsule Take 1  capsule (20 mg total) by mouth 2 (two) times daily before a meal. Take 1 capsule 1 hour before dinner daily. 03/28/21   Wieters, Hallie C, PA-C  Pseudoeph-Doxylamine-DM-APAP (NYQUIL PO) Take by mouth.    [provider]  Pseudoephedrine-APAP-DM (DAYQUIL PO) Take by mouth.    [provider]  Vitamin D, Ergocalciferol, (DRISDOL) 1.25 MG (50000 UNIT) CAPS capsule Take 1 capsule (50,000 Units total) by mouth every 7 (seven) days. 04/13/20   Janeece Agee, NP    Family History No family history on file. Social History Social History   Tobacco Use   Smoking status: Never    Passive exposure: Never   Smokeless tobacco: Never  Vaping Use   Vaping status: Never Used  Substance Use Topics   Alcohol use: No   Drug use: Never   Allergies   Patient has no known allergies.  Review of Systems Review of Systems Pertinent findings revealed after performing a 14 point review of systems has been noted in the history of present illness.  Physical Exam Vital Signs BP 126/74 (BP Location: Right Arm)   Pulse 98   Temp 97.6 F (36.4 C)   Resp 20   Ht 5\' 7"  (1.702 m)   Wt 200 lb (90.7 kg)   SpO2 100%   BMI 31.32 kg/m   No data found.  Physical Exam Vitals and nursing note reviewed.  Constitutional:      General: He is not in acute distress.    Appearance: Normal appearance.  He is not ill-appearing.  HENT:     Head: Normocephalic and atraumatic.     Salivary Glands: Right salivary gland is not diffusely enlarged or tender. Left salivary gland is not diffusely enlarged or tender.     Right Ear: Ear canal and external ear normal. No drainage. A middle ear effusion is present. There is no impacted cerumen. Tympanic membrane is bulging. Tympanic membrane is not injected or erythematous.     Left Ear: Ear canal and external ear normal. No drainage. A middle ear effusion is present. There is no impacted cerumen. Tympanic membrane is bulging. Tympanic membrane is not injected or  erythematous.     Ears:     Comments: Bilateral EACs normal, both TMs bulging with clear fluid    Nose: Rhinorrhea present. No nasal deformity, septal deviation, signs of injury, nasal tenderness, mucosal edema or congestion. Rhinorrhea is clear.     Right Nostril: Occlusion present. No foreign body, epistaxis or septal hematoma.     Left Nostril: Occlusion present. No foreign body, epistaxis or septal hematoma.     Right Turbinates: Enlarged, swollen and pale.     Left Turbinates: Enlarged, swollen and pale.     Right Sinus: No maxillary sinus tenderness or frontal sinus tenderness.     Left Sinus: No maxillary sinus tenderness or frontal sinus tenderness.     Mouth/Throat:     Lips: Pink. No lesions.     Mouth: Mucous membranes are moist. No oral lesions.     Pharynx: Oropharynx is clear. Uvula midline. No posterior oropharyngeal erythema or uvula swelling.     Tonsils: No tonsillar exudate. 0 on the right. 0 on the left.     Comments: Postnasal drip Eyes:     General: Lids are normal.        Right eye: No discharge.        Left eye: No discharge.     Extraocular Movements: Extraocular movements intact.     Conjunctiva/sclera: Conjunctivae normal.     Right eye: Right conjunctiva is not injected.     Left eye: Left conjunctiva is not injected.  Neck:     Trachea: Trachea and phonation normal.  Cardiovascular:     Rate and Rhythm: Normal rate and regular rhythm.     Pulses: Normal pulses.     Heart sounds: Normal heart sounds. No murmur heard.    No friction rub. No gallop.  Pulmonary:     Effort: Pulmonary effort is normal. No accessory muscle usage, prolonged expiration or respiratory distress.     Breath sounds: Normal breath sounds. No stridor, decreased air movement or transmitted upper airway sounds. No decreased breath sounds, wheezing, rhonchi or rales.  Chest:     Chest wall: No tenderness.  Musculoskeletal:        General: Normal range of motion.     Cervical back:  Normal range of motion and neck supple. Normal range of motion.  Lymphadenopathy:     Cervical: No cervical adenopathy.  Skin:    General: Skin is warm and dry.     Findings: No erythema or rash.  Neurological:     General: No focal deficit present.     Mental Status: He is alert and oriented to person, place, and time.  Psychiatric:        Mood and Affect: Mood normal.        Behavior: Behavior normal.     Visual Acuity Right Eye Distance:   Left Eye  Distance:   Bilateral Distance:    Right Eye Near:   Left Eye Near:    Bilateral Near:     UC Couse / Diagnostics / Procedures:     Radiology No results found.  Procedures Ear Cerumen Removal  Date/Time: 11/15/2023 11:21 AM  Performed by: Theadora Rama Scales, PA-C Authorized by: Theadora Rama Scales, PA-C   Consent:    Consent obtained:  Verbal   Consent given by:  Patient   Risks, benefits, and alternatives were discussed: yes     Risks discussed:  Bleeding, infection, pain, TM perforation, incomplete removal and dizziness   Alternatives discussed:  No treatment, delayed treatment, alternative treatment, observation and referral Universal protocol:    Procedure explained and questions answered to patient or proxy's satisfaction: yes     Patient identity confirmed:  Verbally with patient and arm band Procedure details:    Location:  L ear and R ear   Procedure type: irrigation   Post-procedure details:    Inspection:  No bleeding and some cerumen remaining   Hearing quality:  Normal   Procedure completion:  Tolerated  (including critical care time) EKG  Pending results:  Labs Reviewed - No data to display  Medications Ordered in UC: Medications - No data to display  UC Diagnoses / Final Clinical Impressions(s)   I have reviewed the triage vital signs and the nursing notes.  Pertinent labs & imaging results that were available during my care of the patient were reviewed by me and considered in my  medical decision making (see chart for details).    Final diagnoses:  Bilateral impacted cerumen  Allergic rhinitis, unspecified seasonality, unspecified trigger   Cerumen cannot be completely removed from either ear, patient did report both ears feeling better and TMs are partially visualized, no concern for ear infection.  Patient advised to resume allergy medications, prescription sent to pharmacy.  Conservative care recommended.  Return precautions advised.  Please see discharge instructions below for details of plan of care as provided to patient. ED Prescriptions     Medication Sig Dispense Auth. Provider   montelukast (SINGULAIR) 10 MG tablet Take 1 tablet (10 mg total) by mouth at bedtime. 90 tablet Theadora Rama Scales, PA-C   levocetirizine (XYZAL) 5 MG tablet Take 1 tablet (5 mg total) by mouth every evening. 90 tablet Theadora Rama Scales, PA-C   mometasone (NASONEX) 50 MCG/ACT nasal spray Place 2 sprays into the nose daily. 1 each Theadora Rama Scales, PA-C      PDMP not reviewed this encounter.  Pending results:  Labs Reviewed - No data to display    Discharge Instructions      Your symptoms and my physical exam findings are concerning for exacerbation of your underlying allergies.     Please read below to learn more about the medications, dosages and frequencies that I recommend to help alleviate your symptoms and to get you feeling better soon:   Xyzal (levocetirizine): This is an excellent second-generation antihistamine that helps to reduce respiratory inflammatory response to environmental allergens.  In some patients, this medication can cause daytime sleepiness so I recommend that you take 1 tablet daily at bedtime.    Singulair (montelukast): This is a mast cell stabilizer that works well with antihistamines.  Mast cells are responsible for stimulating histamine production.  Reducing the activity of mast cells decreases the amount of histamines will be  produced.  This reduces upper and lower respiratory inflammation caused by allergy exposure.  I  recommend that you take this medication at the same time you take your antihistamine.   Nasonex (mometasone): This is a steroid nasal spray that used once daily, 1 spray in each nare.  This works best when used on a daily basis. This medication does not work well if it is only used when you think you need it.  After 3 to 5 days of use, you will notice significant reduction of the inflammation and mucus production that is currently being caused by exposure to allergens, whether seasonal or environmental.  The most common side effect of this medication is nosebleeds.  If you experience a nosebleed, please discontinue use for 1 week, then feel free to resume.   If you find that your insurance will not pay for this medication, please consider a different nasal steroids such as Flonase (fluticasone), or Nasacort (triamcinolone).    If symptoms have not meaningfully improved in the next 5 to 7 days, please return for repeat evaluation or follow-up with your regular provider.  If symptoms have worsened in the next 3 to 5 days, please return for repeat evaluation or follow-up with your regular provider.    Thank you for visiting urgent care today.  We appreciate the opportunity to participate in your care.       Disposition Upon Discharge:  Condition: stable for discharge home  Patient presented with an acute illness with associated systemic symptoms and significant discomfort requiring urgent management. In my opinion, this is a condition that a prudent lay person (someone who possesses an average knowledge of health and medicine) may potentially expect to result in complications if not addressed urgently such as respiratory distress, impairment of bodily function or dysfunction of bodily organs.   Routine symptom specific, illness specific and/or disease specific instructions were discussed with the patient  and/or caregiver at length.   As such, the patient has been evaluated and assessed, work-up was performed and treatment was provided in alignment with urgent care protocols and evidence based medicine.  Patient/parent/caregiver has been advised that the patient may require follow up for further testing and treatment if the symptoms continue in spite of treatment, as clinically indicated and appropriate.  Patient/parent/caregiver has been advised to return to the [facility] or PCP if no better; to PCP or the Emergency Department if new signs and symptoms develop, or if the current signs or symptoms continue to change or worsen for further workup, evaluation and treatment as clinically indicated and appropriate  The patient will follow up with their current PCP if and as advised. If the patient does not currently have a PCP we will assist them in obtaining one.   The patient may need specialty follow up if the symptoms continue, in spite of conservative treatment and management, for further workup, evaluation, consultation and treatment as clinically indicated and appropriate.  Patient/parent/caregiver verbalized understanding and agreement of plan as discussed.  All questions were addressed during visit.  Please see discharge instructions below for further details of plan.  This office note has been dictated using Teaching laboratory technician.  Unfortunately, this method of dictation can sometimes lead to typographical or grammatical errors.  I apologize for your inconvenience in advance if this occurs.  Please do not hesitate to reach out to me if clarification is needed.      Theadora Rama Scales, PA-C 11/15/23 1123

## 2024-12-24 ENCOUNTER — Encounter: Payer: Self-pay | Admitting: Emergency Medicine

## 2024-12-24 ENCOUNTER — Ambulatory Visit
Admission: EM | Admit: 2024-12-24 | Discharge: 2024-12-24 | Disposition: A | Discharge status: Home / Self Care | Attending: Family Medicine | Admitting: Family Medicine

## 2024-12-24 DIAGNOSIS — M549 Dorsalgia, unspecified: Secondary | ICD-10-CM

## 2024-12-24 DIAGNOSIS — R079 Chest pain, unspecified: Secondary | ICD-10-CM | POA: Diagnosis not present

## 2024-12-24 DIAGNOSIS — M545 Low back pain, unspecified: Secondary | ICD-10-CM | POA: Diagnosis not present

## 2024-12-24 DIAGNOSIS — R5383 Other fatigue: Secondary | ICD-10-CM | POA: Diagnosis not present

## 2024-12-24 DIAGNOSIS — R9431 Abnormal electrocardiogram [ECG] [EKG]: Secondary | ICD-10-CM

## 2024-12-24 NOTE — ED Notes (Signed)
 Patient is being discharged from the Urgent Care and sent to the Emergency Department via personal vehicle . Per Loreda Myla SAUNDERS, NP , patient is in need of higher level of care due to chest pain. Patient is aware and verbalizes understanding of plan of care.  Vitals:   12/24/24 1139  BP: 127/82  Pulse: 72  Resp: 14  Temp: 98.4 F (36.9 C)  SpO2: 96%

## 2024-12-24 NOTE — ED Provider Notes (Signed)
 " [facility]    CSN: [id number] Arrival date & time: 12/24/24  1021      History   Chief Complaint Chief Complaint  Patient presents with   Back Pain   Chest Pain   Fatigue    HPI Albert Mccormick is a 59 y.o. male presents for chest pain, back pain and fatigue.  Patient has a past medical history of GERD.  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  States prior to the flight he was doing some moving and strenuous activity and stated his low back pain and upper back pain started prior to the flight.  He had 1 episode of vomiting on the plane and when he got off he states he developed a bilateral mid chest pain that is intermittent and does not radiate.  He endorses fatigue but denies shortness of breath any additional nausea or vomiting, palpitations, headaches, syncope or orthopnea, lower extremity swelling.  Denies asthma or smoking history.  Denies any URI symptoms.  Cannot identify any aggravating or alleviating factors of his symptoms.  He has tried DayQuil and NyQuil without improvement.  No other concerns.   Back Pain Associated symptoms: chest pain   Chest Pain Associated symptoms: back pain and fatigue     Past Medical History:  Diagnosis Date   Acid reflux     Patient Active Problem List   Diagnosis Date Noted   Effusion of finger joint 03/31/2020   Fatigue 03/31/2020   Colon cancer screening 03/31/2020   Encounter for vitamin deficiency screening 03/31/2020   Prostate cancer screening 03/31/2020    History reviewed. No pertinent surgical history.     Home Medications    Prior to Admission medications  Medication Sig Start Date End Date Taking? Authorizing Provider  pantoprazole (PROTONIX) 40 MG tablet Take 40 mg by mouth daily. 09/08/24  Yes [provider]  levocetirizine (XYZAL ) 5 MG tablet Take 1 tablet (5 mg total) by mouth every evening. Patient not taking: Reported on 12/24/2024 11/15/23 05/13/24  Joesph Shaver Scales, PA-C  mometasone  (NASONEX ) 50 MCG/ACT nasal spray Place 2 sprays into the nose daily. Patient not taking: Reported on 12/24/2024 11/15/23 02/13/24  Joesph Shaver Scales, PA-C  montelukast  (SINGULAIR ) 10 MG tablet Take 1 tablet (10 mg total) by mouth at bedtime. Patient not taking: Reported on 12/24/2024 11/15/23 05/13/24  Joesph Shaver Scales, PA-C    Family History History reviewed. No pertinent family history.  Social History Social History[1]   Allergies   Patient has no known allergies.   Review of Systems Review of Systems  Constitutional:  Positive for fatigue.  Cardiovascular:  Positive for chest pain.  Musculoskeletal:  Positive for back pain.     Physical Exam Triage Vital Signs ED Triage Vitals  Encounter Vitals Group     BP 12/24/24 1139 127/82     Girls Systolic BP Percentile --      Girls Diastolic BP Percentile --      Boys Systolic BP Percentile --      Boys Diastolic BP Percentile --      Pulse Rate 12/24/24 1139 72     Resp 12/24/24 1139 14     Temp 12/24/24 1139 98.4 F (36.9 C)     Temp Source 12/24/24 1139 Oral     SpO2 12/24/24 1139 96 %     Weight --      Height --      Head Circumference --  Peak Flow --      Pain Score 12/24/24 1140 8     Pain Loc --      Pain Education --      Exclude from Growth Chart --    No data found.  Updated Vital Signs BP 127/82 (BP Location: Left Arm)   Pulse 72   Temp 98.4 F (36.9 C) (Oral)   Resp 14   SpO2 96%   Visual Acuity Right Eye Distance:   Left Eye Distance:   Bilateral Distance:    Right Eye Near:   Left Eye Near:    Bilateral Near:     Physical Exam Vitals and nursing note reviewed.  Constitutional:      General: He is not in acute distress.    Appearance: Normal appearance. He is not ill-appearing.  HENT:     Head: Normocephalic and atraumatic.  Eyes:     Pupils: Pupils are equal, round, and reactive to light.  Cardiovascular:     Rate and Rhythm: Normal  rate and regular rhythm.     Heart sounds: Normal heart sounds.  Pulmonary:     Effort: Pulmonary effort is normal.     Breath sounds: Normal breath sounds. No wheezing or rales.  Chest:     Chest wall: No tenderness.  Musculoskeletal:     Cervical back: No tenderness or bony tenderness.     Thoracic back: Tenderness present. No swelling or bony tenderness.     Lumbar back: Tenderness present. No bony tenderness.       Back:  Skin:    General: Skin is warm.  Neurological:     General: No focal deficit present.     Mental Status: He is alert and oriented to person, place, and time.  Psychiatric:        Mood and Affect: Mood normal.        Behavior: Behavior normal.     PERC Criteria for low probability for Pulmonary Embolism  Age <50 years  Heart rate <100 beats/minute  Oxyhemoglobin saturation >=95 percent  No hemoptysis  No estrogen use  No prior DVT or PE  No unilateral leg swelling  No surgery/trauma requiring hospitalization within the prior four weeks  Total score: 1    Marburg score   Known clinical vascular disease  Age/sex (F >=65 years or M >=55 years)  Increased pain with exercise  Pain not reproducible by palpation  Patient assumes pain is of cardiac origin  Score ranges from 0 to 5 points. One point for each answer 0-2 points: negative result 3-5 points: positive result  Total score: 3  Interchest score  History of CAD  Age/sex (F >=65 years or M >=55 years)  Increased pain with exercise  Pain reproducible by palpation (-1 if yes) Physician assumes cardiac origin  Pain feels like pressure   Score ranges from ?1 to +5 points. One point for each answer except reproducible by palpation  <2 points: CAD negative 2-5 points: CAD positive   Total score: 2  UC Treatments / Results  Labs (all labs ordered are listed, but only abnormal results are displayed) Labs Reviewed - No data to display  EKG   Radiology No results  found.  Procedures ED EKG  Date/Time: 12/24/2024 12:18 PM  Performed by: Loreda Myla SAUNDERS, NP Authorized by: Loreda Myla SAUNDERS, NP   ECG interpreted by ED Physician in the absence of a cardiologist: no   Previous ECG:    Previous ECG:  Unavailable Interpretation:    Interpretation: abnormal   Rate:    ECG rate:  73   ECG rate assessment: normal   Rhythm:    Rhythm: sinus rhythm   Ectopy:    Ectopy: none   QRS:    QRS axis:  Normal T waves:    T waves: non-specific    (including critical care time)  Medications Ordered in UC Medications - No data to display  Initial Impression / Assessment and Plan / UC Course  I have reviewed the triage vital signs and the nursing notes.  Pertinent labs & imaging results that were available during my care of the patient were reviewed by me and considered in my medical decision making (see chart for details).     I reviewed exam and symptoms with patient.  Discussed limitations and abilities of urgent care.  Patient presenting with diffuse back pain after moving.  After long 13-hour flight he developed chest pain with severe fatigue.  His EKG is abnormal.  Given his symptoms with recent long distance travel and abnormal EKG, advised ER for further evaluation and treatment.  He is in agreement with plan will go POV with his wife driving to the emergency room.  He was instructed to pull over and call 911 for any worsening symptoms that occur in transit and he verbalized understanding. Final Clinical Impressions(s) / UC Diagnoses   Final diagnoses:  Chest pain, unspecified type  Acute bilateral low back pain, unspecified whether sciatica present  Upper back pain  Abnormal EKG     Discharge Instructions      Please go to the emergency room for further evaluation of your chest pain    ED Prescriptions   None    PDMP not reviewed this encounter.     [1]  Social History Tobacco Use   Smoking status: Never    Passive exposure: Never    Smokeless tobacco: Never  Vaping Use   Vaping status: Never Used  Substance Use Topics   Alcohol use: No   Drug use: Never     Loreda Myla SAUNDERS, NP 12/24/24 1219  "

## 2024-12-24 NOTE — Discharge Instructions (Addendum)
Please go to the emergency room for further evaluation of your chest pain

## 2024-12-24 NOTE — ED Triage Notes (Addendum)
 Pt reports back pain (upper, mid, & low back) as well as mid-chest pain x5 days. Pt notes he was in the process of moving and did a lot of heavy lifting prior to noticing the pain. Describes sharp back pain and intermittent chest pain. The pains are exacerbated by movement and lifting items. Denies SOB, palpitations, headaches, dizziness, or blurred vision. Pt notes he does feel very fatigued and weak. Pt has taken dayquil and nyquil with no relief of symptoms. Denies medical hx aside from acid reflux.
